# Patient Record
Sex: Male | Born: 1955 | Race: Black or African American | Hispanic: No | Marital: Single | Smoking: Never smoker
Health system: Southern US, Community
[De-identification: ages and names within clinical notes are randomized; demographics above are authoritative.]

## PROBLEM LIST (undated history)

## (undated) DIAGNOSIS — M109 Gout, unspecified: Secondary | ICD-10-CM

## (undated) DIAGNOSIS — B1910 Unspecified viral hepatitis B without hepatic coma: Secondary | ICD-10-CM

## (undated) HISTORY — DX: Gout, unspecified: M10.9

## (undated) HISTORY — DX: Unspecified viral hepatitis B without hepatic coma: B19.10

## (undated) HISTORY — PX: ABDOMINAL SURGERY: SHX537

## (undated) HISTORY — PX: HERNIA REPAIR: SHX51

---

## 2001-12-31 ENCOUNTER — Ambulatory Visit (HOSPITAL_COMMUNITY): Admission: RE | Admit: 2001-12-31 | Discharge: 2001-12-31 | Payer: Self-pay | Admitting: Family Medicine

## 2001-12-31 ENCOUNTER — Encounter: Payer: Self-pay | Admitting: Family Medicine

## 2003-08-12 ENCOUNTER — Encounter (INDEPENDENT_AMBULATORY_CARE_PROVIDER_SITE_OTHER): Payer: Self-pay | Admitting: Specialist

## 2003-08-12 ENCOUNTER — Ambulatory Visit (HOSPITAL_COMMUNITY): Admission: RE | Admit: 2003-08-12 | Discharge: 2003-08-12 | Payer: Self-pay | Admitting: Neurosurgery

## 2006-11-16 ENCOUNTER — Emergency Department (HOSPITAL_COMMUNITY): Admission: EM | Admit: 2006-11-16 | Discharge: 2006-11-16 | Payer: Self-pay | Admitting: Emergency Medicine

## 2007-03-03 ENCOUNTER — Emergency Department (HOSPITAL_COMMUNITY): Admission: EM | Admit: 2007-03-03 | Discharge: 2007-03-03 | Payer: Self-pay | Admitting: *Deleted

## 2010-08-24 NOTE — Op Note (Signed)
Jason Schaefer, Jason Schaefer                           ACCOUNT NO.:  [id number]   MEDICAL RECORD NO.:  [id number]                   PATIENT TYPE:  OIB   LOCATION:  2899                                 FACILITY:  [facility]   PHYSICIAN:  Coletta Memos, M.D.                  DATE OF BIRTH:  January 26, 1956   DATE OF PROCEDURE:  08/12/2003  DATE OF DISCHARGE:                                 OPERATIVE REPORT   PREOPERATIVE DIAGNOSIS:  Generalized muscle weakness.   POSTOPERATIVE DIAGNOSIS:  Generalized muscle weakness.   PROCEDURE:  Right deltoid muscle biopsy.   COMPLICATIONS:  None.   SPECIMENS:  Four pieces of muscle, two pieces of muscle placed on tension.   ANESTHESIA:  General via laryngeal mask airway.   INDICATIONS:  Jason Schaefer was sent to me for a deltoid muscle biopsy  secondary to unknown etiology causing generalized weakness.   The patient was brought to the operating room, placed under general  anesthetic via laryngeal mask airway.  His right shoulder was prepped, and  he was draped in a sterile fashion.  I infiltrated 7 mL of 0.5% lidocaine  and 1:200,000 strength epinephrine postoperatively after I had finished  taking the biopsy samples.  I opened the skin with a #15 blade and took that  down through the superficial layers.  I then identified the muscle and took  four pieces of muscle, two of which I placed on tension, two of which were  free pieces.  There was no bleeding from the wound.  Hemostasis was  obtained.  I then closed the wound in a layered fashion.  I infiltrated the  wound at that point with a local anesthetic.  Dermabond was used for a  sterile dressing.  The patient tolerated the procedure well.                                               Coletta Memos, M.D.    KC/MEDQ  D:  08/12/2003  T:  08/13/2003  Job:  [id number]

## 2011-03-01 ENCOUNTER — Encounter: Payer: Self-pay | Admitting: Emergency Medicine

## 2011-03-01 ENCOUNTER — Emergency Department (HOSPITAL_COMMUNITY)
Admission: EM | Admit: 2011-03-01 | Discharge: 2011-03-01 | Disposition: A | Discharge status: Home / Self Care | Payer: Self-pay | Attending: Emergency Medicine | Admitting: Emergency Medicine

## 2011-03-01 DIAGNOSIS — M109 Gout, unspecified: Secondary | ICD-10-CM | POA: Insufficient documentation

## 2011-03-01 DIAGNOSIS — M25579 Pain in unspecified ankle and joints of unspecified foot: Secondary | ICD-10-CM | POA: Insufficient documentation

## 2011-03-01 LAB — GLUCOSE, CAPILLARY: Glucose-Capillary: 98 mg/dL (ref 70–99)

## 2011-03-01 MED ORDER — HYDROCODONE-ACETAMINOPHEN 5-325 MG PO TABS: 2.0000 | ORAL_TABLET | ORAL | Status: AC | PRN

## 2011-03-01 MED ORDER — INDOMETHACIN 50 MG PO CAPS: 50.0000 mg | ORAL_CAPSULE | Freq: Three times a day (TID) | ORAL | Status: AC

## 2011-03-01 NOTE — ED Provider Notes (Signed)
History     CSN: [id number] Arrival date & time: 03/01/2011  4:06 PM   First MD Initiated Contact with Patient 03/01/11 1848      Chief Complaint  Patient presents with  . Ankle Pain    LT ankle pain/swelling x 2 wks.  unsure if injured it    HPI: Patient is a 55 y.o. male presenting with ankle pain. The history is provided by the patient.  Ankle Pain  The incident occurred more than 1 week ago. There was no injury mechanism. The pain is present in the left foot. The quality of the pain is described as throbbing.  Reports increasing pain to (R) ankle x 2 weeks. Admits to hx of gout and pain is very similar to gout but her states he has not had in the ankle before. Denies injury.  History reviewed. No pertinent past medical history.  History reviewed. No pertinent past surgical history.  No family history on file.  History  Substance Use Topics  . Smoking status: Never Smoker   . Smokeless tobacco: Not on file  . Alcohol Use: Yes      Review of Systems  Constitutional: Negative.   HENT: Negative.   Eyes: Negative.   Respiratory: Negative.   Cardiovascular: Negative.   Gastrointestinal: Negative.   Genitourinary: Negative.   Musculoskeletal: Negative.   Skin: Negative.   Neurological: Negative.   Hematological: Negative.   Psychiatric/Behavioral: Negative.     Allergies  Review of patient's allergies indicates no known allergies.  Home Medications  No current outpatient prescriptions on file.  BP 141/73  Pulse 65  Temp(Src) 99.1 F (37.3 C) (Oral)  Resp 20  SpO2 98%  Physical Exam  Constitutional: He is oriented to person, place, and time. He appears well-developed and well-nourished.  HENT:  Head: Normocephalic and atraumatic.  Eyes: Conjunctivae are normal.  Cardiovascular: Normal rate.   Pulmonary/Chest: Effort normal.  Musculoskeletal:       Feet:       Slight swelling, mild erythema and TTP to medial aspect of (L) ankel.  Neurological: He  is alert and oriented to person, place, and time.  Skin: Skin is warm and dry.  Psychiatric: He has a normal mood and affect.    ED Course  Procedures (including critical care time)   Labs Reviewed  GLUCOSE, CAPILLARY  POCT CBG MONITORING   No results found.   No diagnosis found.    MDM  HPI and PE c/w gout.        Leanne Chang, NP 03/01/11 1935

## 2011-03-01 NOTE — ED Provider Notes (Signed)
Medical screening examination/treatment/procedure(s) were performed by non-physician practitioner and as supervising physician I was immediately available for consultation/collaboration.  Sylvester Minton L Tywan Siever, MD 03/01/11 2349 

## 2011-03-01 NOTE — ED Notes (Signed)
Denies any injury states that he has swelling to lt ankle and that he was dx as a possible diabetic a long time ago but never was seen for this, also  Has blurred vission at times and when he sleeps he awakens to his clothes wet.

## 2014-06-02 ENCOUNTER — Encounter (HOSPITAL_COMMUNITY): Payer: Self-pay

## 2014-06-02 ENCOUNTER — Emergency Department (HOSPITAL_COMMUNITY)
Admission: EM | Admit: 2014-06-02 | Discharge: 2014-06-02 | Disposition: A | Discharge status: Home / Self Care | Payer: Self-pay | Attending: Emergency Medicine | Admitting: Emergency Medicine

## 2014-06-02 DIAGNOSIS — M109 Gout, unspecified: Secondary | ICD-10-CM

## 2014-06-02 DIAGNOSIS — Z79899 Other long term (current) drug therapy: Secondary | ICD-10-CM | POA: Insufficient documentation

## 2014-06-02 DIAGNOSIS — M10062 Idiopathic gout, left knee: Secondary | ICD-10-CM | POA: Insufficient documentation

## 2014-06-02 DIAGNOSIS — M25462 Effusion, left knee: Secondary | ICD-10-CM

## 2014-06-02 MED ORDER — OXYCODONE-ACETAMINOPHEN 5-325 MG PO TABS: 1.0000 | ORAL_TABLET | Freq: Four times a day (QID) | ORAL | Status: DC | PRN

## 2014-06-02 MED ORDER — KETOROLAC TROMETHAMINE 30 MG/ML IJ SOLN
30.0000 mg | Freq: Once | INTRAMUSCULAR | Status: AC
  Administered 2014-06-02: 30 mg via INTRAMUSCULAR
  Filled 2014-06-02: qty 1

## 2014-06-02 MED ORDER — PREDNISONE 20 MG PO TABS: ORAL_TABLET | ORAL | Status: DC

## 2014-06-02 NOTE — ED Notes (Signed)
Patient reports left knee swelling x 2 weeks.  States today the pain is unbearable.

## 2014-06-02 NOTE — ED Provider Notes (Signed)
CSN: [id number]     Arrival date & time 06/02/14  0505 History   First MD Initiated Contact with Patient 06/02/14 [phone]     Chief Complaint  Patient presents with  . Joint Swelling     (Consider location/radiation/quality/duration/timing/severity/associated sxs/prior Treatment) HPI Comments: Jason Schaefer is a 59 y.o. Male with a PMHx of gout, who presents to the ED with complaints of left knee pain 2 weeks. He was seen by his primary care doctor who gave him allopurinol and prednisone 2 weeks ago which relieved his symptoms for approximately 5 days during the prednisone course, but when he stopped taking prednisone the pain returned. Pain is 9/10 constant throbbing located to the medial aspect of the left knee, nonradiating, worse with movement and weightbearing, and improved with allopurinol, prednisone, and oxycodone. There is associated swelling. Denies any fevers, chills, chest pain or shortness of breath, abdominal pain, nausea, vomiting, diarrhea, dysuria, hematuria, penile discharge, numbness, tingling, weakness, erythema, or warmth. No history of injury.  Patient is a 59 y.o. male presenting with knee pain. The history is provided by the patient. No language interpreter was used.  Knee Pain Location:  Knee Time since incident:  2 weeks Injury: no   Knee location:  L knee Pain details:    Quality:  Throbbing   Radiates to:  Does not radiate   Severity:  Severe   Onset quality:  Gradual   Duration:  2 weeks   Timing:  Constant   Progression:  Unchanged Chronicity:  Recurrent Prior injury to area:  No Relieved by: oxycodone, prednisone, and allopurinol. Worsened by:  Bearing weight and activity Ineffective treatments:  None tried Associated symptoms: decreased ROM (due to pain) and swelling   Associated symptoms: no back pain, no fever, no muscle weakness, no numbness and no tingling     History reviewed. No pertinent past medical history. Past Surgical History  Procedure  Laterality Date  . Abdominal surgery     History reviewed. No pertinent family history. History  Substance Use Topics  . Smoking status: Never Smoker   . Smokeless tobacco: Not on file  . Alcohol Use: No    Review of Systems  Constitutional: Negative for fever and chills.  Respiratory: Negative for shortness of breath.   Cardiovascular: Negative for chest pain.  Gastrointestinal: Negative for nausea, vomiting, abdominal pain, diarrhea and constipation.  Genitourinary: Negative for dysuria and hematuria.  Musculoskeletal: Positive for joint swelling (L knee) and arthralgias (L knee). Negative for myalgias and back pain.  Skin: Negative for rash.  Neurological: Negative for weakness and numbness.   10 Systems reviewed and are negative for acute change except as noted in the HPI.    Allergies  Review of patient's allergies indicates no known allergies.  Home Medications   Prior to Admission medications   Medication Sig Start Date End Date Taking? Authorizing Provider  allopurinol (ZYLOPRIM) 100 MG tablet Take 100 mg by mouth daily.   Yes Historical Provider, MD  Oxycodone HCl 10 MG TABS Take 10 mg by mouth every 6 (six) hours as needed (pain).   Yes Historical Provider, MD   BP 176/83 mmHg  Pulse 97  Temp(Src) 98.5 F (36.9 C) (Oral)  Resp 16  Ht  (1.676 m)  Wt 165 lb (74.844 kg)  BMI 26.64 kg/m2  SpO2 98% Physical Exam  Constitutional: He is oriented to person, place, and time. Vital signs are normal. He appears well-developed and well-nourished.  Non-toxic appearance. No distress.  Afebrile,  nontoxic, NAD  HENT:  Head: Normocephalic and atraumatic.  Mouth/Throat: Mucous membranes are normal.  Eyes: Conjunctivae and EOM are normal. Right eye exhibits no discharge. Left eye exhibits no discharge.  Neck: Normal range of motion. Neck supple.  Cardiovascular: Normal rate and intact distal pulses.   Pulmonary/Chest: Effort normal. No respiratory distress.    Abdominal: Normal appearance. He exhibits no distension.  Musculoskeletal:       Left knee: He exhibits decreased range of motion (due to pain), swelling and effusion. He exhibits no deformity, no erythema, normal alignment, no LCL laxity, normal patellar mobility, no bony tenderness and no MCL laxity. Tenderness found. Medial joint line tenderness noted.       Legs: L knee with limited ROM due to pain but passive ROM intact, +medial joint line TTP, no bony TTP, mild swelling and effusion, no deformity, no bruising, no abnormal alignment or patellar mobility, no varus/valgus laxity, neg anterior drawer test, no crepitus. Mildly warm to touch, no erythema. Strength 5/5 in all extremities, sensation grossly intact, distal pulses intact  Neurological: He is alert and oriented to person, place, and time. He has normal strength. No sensory deficit.  Skin: Skin is warm, dry and intact. No rash noted.  Psychiatric: He has a normal mood and affect.  Nursing note and vitals reviewed.   ED Course  Procedures (including critical care time) Labs Review Labs Reviewed - No data to display  Imaging Review No results found.   EKG Interpretation None      MDM   Final diagnoses:  Knee swelling, left  Acute gout of left knee, unspecified cause    59 y.o. male with L knee pain c/w his gout flare. Recently started allopurinol and prednisone, after prednisone finished pain returned. Exam reveals slight warmth but passive ROM intact, doubt septic joint. Will treat with prednisone again and have him f/up with PCP in 3-5 days. I explained the diagnosis and have given explicit precautions to return to the ER including for any other new or worsening symptoms. The patient understands and accepts the medical plan as it's been dictated and I have answered their questions. Discharge instructions concerning home care and prescriptions have been given. The patient is STABLE and is discharged to home in good  condition.  BP 176/83 mmHg  Pulse 97  Temp(Src) 98.5 F (36.9 C) (Oral)  Resp 16  Ht 5\' 6"  (1.676 m)  Wt 165 lb (74.844 kg)  BMI 26.64 kg/m2  SpO2 98%  Meds ordered this encounter  Medications  . ketorolac (TORADOL) 30 MG/ML injection 30 mg    Sig:   . predniSONE (DELTASONE) 20 MG tablet    Sig: 3 tabs po x 5 days    Dispense:  15 tablet    Refill:  0    Order Specific Question:  Supervising Provider    Answer:  Eber HongMILLER, BRIAN D [[id number]]  . oxyCODONE-acetaminophen (PERCOCET/ROXICET) 5-325 MG per tablet    Sig: Take 1 tablet by mouth every 6 (six) hours as needed for moderate pain or severe pain.    Dispense:  15 tablet    Refill:  0    Order Specific Question:  Supervising Provider    Answer:  Vida RollerMILLER, BRIAN D [address][[id number]]       Fionn Stracke Strupp Mitchellamprubi-Soms, PA-C 06/02/14 [id number]  Derwood KaplanAnkit Nanavati, MD 06/03/14 [phone]

## 2014-06-02 NOTE — Discharge Instructions (Signed)
Your gout needs to be followed up by your regular doctor. Start taking prednisone as directed. Use ibuprofen as needed for pain. Use oxycodone as needed for pain, but don't drive while taking this medication. Use heat as needed for pain. Consume a low purine diet. Follow up in 3-5 days with your primary doctor for recheck. Return to the ER for changes or worsening symptoms.   Gout Gout is when your joints become red, sore, and swell (inflamed). This is caused by the buildup of uric acid crystals in the joints. Uric acid is a chemical that is normally in the blood. If the level of uric acid gets too high in the blood, these crystals form in your joints and tissues. Over time, these crystals can form into masses near the joints and tissues. These masses can destroy bone and cause the bone to look misshapen (deformed). HOME CARE   Do not take aspirin for pain.  Only take medicine as told by your doctor.  Rest the joint as much as you can. When in bed, keep sheets and blankets off painful areas.  Keep the sore joints raised (elevated).  Put warm or cold packs on painful joints. Use of warm or cold packs depends on which works best for you.  Use crutches if the painful joint is in your leg.  Drink enough fluids to keep your pee (urine) clear or pale yellow. Limit alcohol, sugary drinks, and drinks with fructose in them.  Follow your diet instructions. Pay careful attention to how much protein you eat. Include fruits, vegetables, whole grains, and fat-free or low-fat milk products in your daily diet. Talk to your doctor or dietitian about the use of coffee, vitamin C, and cherries. These may help lower uric acid levels.  Keep a healthy body weight. GET HELP RIGHT AWAY IF:   You have watery poop (diarrhea), throw up (vomit), or have any side effects from medicines.  You do not feel better in 24 hours, or you are getting worse.  Your joint becomes suddenly more tender, and you have chills or a  fever. MAKE SURE YOU:   Understand these instructions.  Will watch your condition.  Will get help right away if you are not doing well or get worse. Document Released: 01/02/2008 Document Revised: 08/09/2013 Document Reviewed: 11/06/2011 [facility] Patient Information 2015 [place], [place]. This information is not intended to replace advice given to you by your health care provider. Make sure you discuss any questions you have with your health care provider.  Low-Purine Diet Purines are compounds that affect the level of uric acid in your body. A low-purine diet is a diet that is low in purines. Eating a low-purine diet can prevent the level of uric acid in your body from getting too high and causing gout or kidney stones or both. WHAT DO I NEED TO KNOW ABOUT THIS DIET?  Choose low-purine foods. Examples of low-purine foods are listed in the next section.  Drink plenty of fluids, especially water. Fluids can help remove uric acid from your body. Try to drink 8-16 cups (1.9-3.8 L) a day.  Limit foods high in fat, especially saturated fat, as fat makes it harder for the body to get rid of uric acid. Foods high in saturated fat include pizza, cheese, ice cream, whole milk, fried foods, and gravies. Choose foods that are lower in fat and lean sources of protein. Use olive oil when cooking as it contains healthy fats that are not high in saturated fat.  Limit alcohol. Alcohol interferes with the elimination of uric acid from your body. If you are having a gout attack, avoid all alcohol.  Keep in mind that different people's bodies react differently to different foods. You will probably learn over time which foods do or do not affect you. If you discover that a food tends to cause your gout to flare up, avoid eating that food. You can more freely enjoy foods that do not cause problems. If you have any questions about a food item, talk to your dietitian or health care provider. WHICH FOODS ARE LOW,  MODERATE, AND HIGH IN PURINES? The following is a list of foods that are low, moderate, and high in purines. You can eat any amount of the foods that are low in purines. You may be able to have small amounts of foods that are moderate in purines. Ask your health care provider how much of a food moderate in purines you can have. Avoid foods high in purines. Grains  Foods low in purines: Enriched white bread, pasta, rice, cake, cornbread, popcorn.  Foods moderate in purines: Whole-grain breads and cereals, wheat germ, bran, oatmeal. Uncooked oatmeal. Dry wheat bran or wheat germ.  Foods high in purines: Pancakes, French toast, biscuits, muffins. Vegetables  Foods [place] in purines: All vegetables, except those that are moderate in purines.  Foods moderate in purines: Asparagus, cauliflower, spinach, mushrooms, green peas. Fruits  All fruits are low in purines. Meats and other Protein Foods  Foods low in purines: Eggs, nuts, peanut butter.  Foods moderate in purines: 80-90% lean beef, lamb, veal, pork, poultry, fish, eggs, peanut butter, nuts. Crab, lobster, oysters, and shrimp. Cooked dried beans, peas, and lentils.  Foods high in purines: Anchovies, sardines, herring, mussels, tuna, codfish, scallops, trout, and haddock. Tomasa BlaseBacon. Organ meats (such as liver or kidney). Tripe. Game meat. Goose. Sweetbreads. Dairy  All dairy foods are low in purines. Low-fat and fat-free dairy products are best because they are low in saturated fat. Beverages  Drinks low in purines: Water, carbonated beverages, tea, coffee, cocoa.  Drinks moderate in purines: Soft drinks and other drinks sweetened with high-fructose corn syrup. Juices. To find whether a food or drink is sweetened with high-fructose corn syrup, look at the ingredients list.  Drinks high in purines: Alcoholic beverages (such as beer). Condiments  Foods low in purines: Salt, herbs, olives, pickles, relishes, vinegar.  Foods moderate in  purines: Butter, margarine, oils, mayonnaise. Fats and Oils  Foods low in purines: All types, except gravies and sauces made with meat.  Foods high in purines: Gravies and sauces made with meat. Other Foods  Foods low in purines: Sugars, sweets, gelatin. Cake. Soups made without meat.  Foods moderate in purines: Meat-based or fish-based soups, broths, or bouillons. Foods and drinks sweetened with high-fructose corn syrup.  Foods high in purines: High-fat desserts (such as ice cream, cookies, cakes, pies, doughnuts, and chocolate). Contact your dietitian for more information on foods that are not listed here. Document Released: 07/20/2010 Document Revised: 03/30/2013 Document Reviewed: 03/01/2013 [facility] Patient Information 2015 [place]. This information is not intended to replace advice given to you by your health care provider. Make sure you discuss any questions you have with your health care provider.  Heat Therapy Heat therapy can help make painful, stiff muscles and joints feel better. Do not use heat on new injuries. Wait at least 48 hours after an injury to use heat. Do not use heat when you have aches or pains  right after an activity. If you still have pain 3 hours after stopping the activity, then you may use heat. HOME CARE Wet heat pack  Soak a clean towel in warm water. Squeeze out the extra water.  Put the warm, wet towel in a plastic bag.  Place a thin, dry towel between your skin and the bag.  Put the heat pack on the area for 5 minutes, and check your skin. Your skin may be pink, but it should not be red.  Leave the heat pack on the area for 15 to 30 minutes.  Repeat this every 2 to 4 hours while awake. Do not use heat while you are sleeping. Warm water bath  Fill a tub with warm water.  Place the affected body part in the tub.  Soak the area for 20 to 40 minutes.  Repeat as needed. Hot water bottle  Fill the water bottle half full with hot  water.  Press out the extra air. Close the cap tightly.  Place a dry towel between your skin and the bottle.  Put the bottle on the area for 5 minutes, and check your skin. Your skin may be pink, but it should not be red.  Leave the bottle on the area for 15 to 30 minutes.  Repeat this every 2 to 4 hours while awake. Electric heating pad  Place a dry towel between your skin and the heating pad.  Set the heating pad on low heat.  Put the heating pad on the area for 10 minutes, and check your skin. Your skin may be pink, but it should not be red.  Leave the heating pad on the area for 20 to 40 minutes.  Repeat this every 2 to 4 hours while awake.  Do not lie on the heating pad.  Do not fall asleep while using the heating pad.  Do not use the heating pad near water. GET HELP RIGHT AWAY IF:  You get blisters or red skin.  Your skin is puffy (swollen), or you lose feeling (numbness) in the affected area.  You have any new problems.  Your problems are getting worse.  You have any questions or concerns. If you have any problems, stop using heat therapy until you see your doctor. MAKE SURE YOU:  Understand these instructions.  Will watch your condition.  Will get help right away if you are not doing well or get worse. Document Released: 06/17/2011 Document Reviewed: 05/18/2013 [facility] Patient Information 2015 [place], [place]. This information is not intended to replace advice given to you by your health care provider. Make sure you discuss any questions you have with your health care provider.

## 2014-06-02 NOTE — ED Notes (Signed)
Pt states he has swelling and pain to his left knee  Pt states he saw his pcp on the 11th and was put on allopurinal and prednisone  Pt states he took prednisone for 5 days and when he stopped it the swelling returned

## 2015-06-23 ENCOUNTER — Emergency Department (HOSPITAL_BASED_OUTPATIENT_CLINIC_OR_DEPARTMENT_OTHER)
Admission: EM | Admit: 2015-06-23 | Discharge: 2015-06-23 | Disposition: A | Discharge status: Home / Self Care | Payer: Self-pay | Attending: Emergency Medicine | Admitting: Emergency Medicine

## 2015-06-23 ENCOUNTER — Encounter (HOSPITAL_BASED_OUTPATIENT_CLINIC_OR_DEPARTMENT_OTHER): Payer: Self-pay

## 2015-06-23 DIAGNOSIS — M109 Gout, unspecified: Secondary | ICD-10-CM | POA: Insufficient documentation

## 2015-06-23 DIAGNOSIS — M25462 Effusion, left knee: Secondary | ICD-10-CM | POA: Insufficient documentation

## 2015-06-23 DIAGNOSIS — Z79899 Other long term (current) drug therapy: Secondary | ICD-10-CM | POA: Insufficient documentation

## 2015-06-23 MED ORDER — COLCHICINE 0.6 MG PO TABS: 0.6000 mg | ORAL_TABLET | Freq: Two times a day (BID) | ORAL | Status: DC

## 2015-06-23 MED ORDER — PREDNISONE 20 MG PO TABS: ORAL_TABLET | ORAL | Status: DC

## 2015-06-23 NOTE — ED Notes (Signed)
Pt reports left knee swelling x 3 years. Reports hx of gout. Reports last flare up was last year. Reports taking allopurinol and steroid with relief.

## 2015-06-23 NOTE — ED Provider Notes (Signed)
CSN: [id number]     Arrival date & time 06/23/15  1820 History   First MD Initiated Contact with Patient 06/23/15 1857     Chief Complaint  Patient presents with  . Joint Swelling     (Consider location/radiation/quality/duration/timing/severity/associated sxs/prior Treatment) HPI   60 year old male with hx of gout who presents complaining of left knee pain.  Pain started 3 days ago along with swelling.  Pain is burning 9/10 worsening with movement.  Has taken oxycodone earlier which helps.  Pain felt similar to prior gout.  Patient states he normally developed data pain once a year with last flare a year ago. He does admits to drinking alcohol, one beer on a daily basis but denies any seafood. He denies any injury, denies having fever, hip pain, ankle pain, or rash. He did take 1 oxycodone earlier which did help but states that in the past steroid and allopurinol has helped. No history of septic joint. No history of IV drug use. He is a nonsmoker.    History reviewed. No pertinent past medical history. Past Surgical History  Procedure Laterality Date  . Abdominal surgery     No family history on file. Social History  Substance Use Topics  . Smoking status: Never Smoker   . Smokeless tobacco: None  . Alcohol Use: No    Review of Systems  Constitutional: Negative for fever.  Musculoskeletal: Positive for joint swelling.  Skin: Negative for rash and wound.  Neurological: Negative for numbness.      Allergies  Review of patient's allergies indicates no known allergies.  Home Medications   Prior to Admission medications   Medication Sig Start Date End Date Taking? Authorizing Provider  allopurinol (ZYLOPRIM) 100 MG tablet Take 100 mg by mouth daily.    Historical Provider, MD  Oxycodone HCl 10 MG TABS Take 10 mg by mouth every 6 (six) hours as needed (pain).    Historical Provider, MD  oxyCODONE-acetaminophen (PERCOCET/ROXICET) 5-325 MG per tablet Take 1 tablet by mouth  every 6 (six) hours as needed for moderate pain or severe pain. 06/02/14   Mercedes Camprubi-Soms, PA-C  predniSONE (DELTASONE) 20 MG tablet 3 tabs po x 5 days 06/02/14   Mercedes Camprubi-Soms, PA-C   BP 156/100 mmHg  Pulse 93  Temp(Src) 98.5 F (36.9 C) (Oral)  Resp 16  Ht 5\' 6"  (1.676 m)  Wt 77.111 kg  BMI 27.45 kg/m2  SpO2 100% Physical Exam  Constitutional: He appears well-developed and well-nourished. No distress.  African-American male, nontoxic in appearance  HENT:  Head: Atraumatic.  Eyes: Conjunctivae are normal.  Neck: Neck supple.  Abdominal: Soft. There is no tenderness.  Musculoskeletal: He exhibits tenderness (Left knee: Knee is moderately swollen, with increasing pain with knee extension secondary to the edema. Mild warmth noted no erythema and no signs of injury. No gross deformity.).  Left hip and left ankle are nontender to palpation, intact distal pedal pulses, normal skin tone throughout. Sensation is intact.  Neurological: He is alert.  Skin: No rash noted.  Psychiatric: He has a normal mood and affect.  Nursing note and vitals reviewed.   ED Course  Procedures (including critical care time) Labs Review Labs Reviewed - No data to display  Imaging Review No results found. I have personally reviewed and evaluated these images and lab results as part of my medical decision-making.   EKG Interpretation None      MDM   Final diagnoses:  Knee swelling, left    BP  156/100 mmHg  Pulse 93  Temp(Src) 98.5 F (36.9 C) (Oral)  Resp 16  Ht  (1.676 m)  Wt 77.111 kg  BMI 27.45 kg/m2  SpO2 100%   8:04 PM Patient presents with left knee pain and swelling similar to prior gouty flare. I have low suspicion for septic arthritis. No injury to indicate advanced imaging. No systemic involvement. I will provide prednisone, and colchicine as treatment. Outpatient follow-up recommended.   Fayrene Helper, PA-C 06/23/15 2005  Tilden Fossa, MD 06/24/15  [phone]

## 2015-06-23 NOTE — ED Notes (Signed)
Pt c/o left knee pani.  Left knee not painful to touch, but very painful with movement.  Pt states he is unable to straighten left leg due to pain.  Pt reports taking allopurinol and prednisone for gout flareups and that those medicines do help, but not immediately.  Pt has not taken any gout medications yet with this flare up but has taken an oxycodone about 12 hours ago with some temporary relief.

## 2015-06-23 NOTE — Discharge Instructions (Signed)

## 2015-07-11 ENCOUNTER — Emergency Department (HOSPITAL_BASED_OUTPATIENT_CLINIC_OR_DEPARTMENT_OTHER)
Admission: EM | Admit: 2015-07-11 | Discharge: 2015-07-11 | Disposition: A | Discharge status: Home / Self Care | Payer: Self-pay | Attending: Emergency Medicine | Admitting: Emergency Medicine

## 2015-07-11 ENCOUNTER — Emergency Department (HOSPITAL_BASED_OUTPATIENT_CLINIC_OR_DEPARTMENT_OTHER): Payer: Self-pay

## 2015-07-11 ENCOUNTER — Encounter (HOSPITAL_BASED_OUTPATIENT_CLINIC_OR_DEPARTMENT_OTHER): Payer: Self-pay

## 2015-07-11 DIAGNOSIS — M109 Gout, unspecified: Secondary | ICD-10-CM

## 2015-07-11 DIAGNOSIS — M10062 Idiopathic gout, left knee: Secondary | ICD-10-CM | POA: Insufficient documentation

## 2015-07-11 MED ORDER — OXYCODONE-ACETAMINOPHEN 5-325 MG PO TABS: ORAL_TABLET | ORAL | Status: DC

## 2015-07-11 MED ORDER — ALLOPURINOL 300 MG PO TABS: 300.0000 mg | ORAL_TABLET | Freq: Every day | ORAL | Status: DC

## 2015-07-11 MED ORDER — KETOROLAC TROMETHAMINE 60 MG/2ML IM SOLN
60.0000 mg | Freq: Once | INTRAMUSCULAR | Status: AC
  Administered 2015-07-11: 60 mg via INTRAMUSCULAR
  Filled 2015-07-11: qty 2

## 2015-07-11 NOTE — Discharge Instructions (Signed)
Please follow with your primary care doctor in the next 2 days for a check-up. They must obtain records for further management.   Do not hesitate to return to the Emergency Department for any new, worsening or concerning symptoms.    Gout Gout is an inflammatory arthritis caused by a buildup of uric acid crystals in the joints. Uric acid is a chemical that is normally present in the blood. When the level of uric acid in the blood is too high it can form crystals that deposit in your joints and tissues. This causes joint redness, soreness, and swelling (inflammation). Repeat attacks are common. Over time, uric acid crystals can form into masses (tophi) near a joint, destroying bone and causing disfigurement. Gout is treatable and often preventable. CAUSES  The disease begins with elevated levels of uric acid in the blood. Uric acid is produced by your body when it breaks down a naturally found substance called purines. Certain foods you eat, such as meats and fish, contain high amounts of purines. Causes of an elevated uric acid level include:  Being passed down from parent to child (heredity).  Diseases that cause increased uric acid production (such as obesity, psoriasis, and certain cancers).  Excessive alcohol use.  Diet, especially diets rich in meat and seafood.  Medicines, including certain cancer-fighting medicines (chemotherapy), water pills (diuretics), and aspirin.  Chronic kidney disease. The kidneys are no longer able to remove uric acid well.  Problems with metabolism. Conditions strongly associated with gout include:  Obesity.  High blood pressure.  High cholesterol.  Diabetes. Not everyone with elevated uric acid levels gets gout. It is not understood why some people get gout and others do not. Surgery, joint injury, and eating too much of certain foods are some of the factors that can lead to gout attacks. SYMPTOMS   An attack of gout comes on quickly. It causes  intense pain with redness, swelling, and warmth in a joint.  Fever can occur.  Often, only one joint is involved. Certain joints are more commonly involved:  Base of the big toe.  Knee.  Ankle.  Wrist.  Finger. Without treatment, an attack usually goes away in a few days to weeks. Between attacks, you usually will not have symptoms, which is different from many other forms of arthritis. DIAGNOSIS  Your caregiver will suspect gout based on your symptoms and exam. In some cases, tests may be recommended. The tests may include:  Blood tests.  Urine tests.  X-rays.  Joint fluid exam. This exam requires a needle to remove fluid from the joint (arthrocentesis). Using a microscope, gout is confirmed when uric acid crystals are seen in the joint fluid. TREATMENT  There are two phases to gout treatment: treating the sudden onset (acute) attack and preventing attacks (prophylaxis).  Treatment of an Acute Attack.  Medicines are used. These include anti-inflammatory medicines or steroid medicines.  An injection of steroid medicine into the affected joint is sometimes necessary.  The painful joint is rested. Movement can worsen the arthritis.  You may use warm or cold treatments on painful joints, depending which works best for you.  Treatment to Prevent Attacks.  If you suffer from frequent gout attacks, your caregiver may advise preventive medicine. These medicines are started after the acute attack subsides. These medicines either help your kidneys eliminate uric acid from your body or decrease your uric acid production. You may need to stay on these medicines for a very long time.  The early phase  of treatment with preventive medicine can be associated with an increase in acute gout attacks. For this reason, during the first few months of treatment, your caregiver may also advise you to take medicines usually used for acute gout treatment. Be sure you understand your caregiver's  directions. Your caregiver may make several adjustments to your medicine dose before these medicines are effective.  Discuss dietary treatment with your caregiver or dietitian. Alcohol and drinks high in sugar and fructose and foods such as meat, poultry, and seafood can increase uric acid levels. Your caregiver or dietitian can advise you on drinks and foods that should be limited. HOME CARE INSTRUCTIONS   Do not take aspirin to relieve pain. This raises uric acid levels.  Only take over-the-counter or prescription medicines for pain, discomfort, or fever as directed by your caregiver.  Rest the joint as much as possible. When in bed, keep sheets and blankets off painful areas.  Keep the affected joint raised (elevated).  Apply warm or cold treatments to painful joints. Use of warm or cold treatments depends on which works best for you.  Use crutches if the painful joint is in your leg.  Drink enough fluids to keep your urine clear or pale yellow. This helps your body get rid of uric acid. Limit alcohol, sugary drinks, and fructose drinks.  Follow your dietary instructions. Pay careful attention to the amount of protein you eat. Your daily diet should emphasize fruits, vegetables, whole grains, and fat-free or low-fat milk products. Discuss the use of coffee, vitamin C, and cherries with your caregiver or dietitian. These may be helpful in lowering uric acid levels.  Maintain a healthy body weight. SEEK MEDICAL CARE IF:   You develop diarrhea, vomiting, or any side effects from medicines.  You do not feel better in 24 hours, or you are getting worse. SEEK IMMEDIATE MEDICAL CARE IF:   Your joint becomes suddenly more tender, and you have chills or a fever. MAKE SURE YOU:   Understand these instructions.  Will watch your condition.  Will get help right away if you are not doing well or get worse.   This information is not intended to replace advice given to you by your health  care provider. Make sure you discuss any questions you have with your health care provider.   Document Released: 03/22/2000 Document Revised: 04/15/2014 Document Reviewed: 11/06/2011 Elsevier Interactive Patient Education [employer] Elsevier Inc.

## 2015-07-11 NOTE — ED Provider Notes (Signed)
CSN: [id number]     Arrival date & time 07/11/15  1600 History   First MD Initiated Contact with Patient 07/11/15 1619     Chief Complaint  Patient presents with  . Leg Swelling     (Consider location/radiation/quality/duration/timing/severity/associated sxs/prior Treatment) HPI   Blood pressure 160/94, pulse 102, temperature 98.7 F (37.1 C), temperature source Oral, resp. rate 24, SpO2 99 %.  Jason Schaefer is a 60 y.o. male complaining of pain and swelling to left leg from knee to foot. History of gout and states that this feels similar to that however, Swelling never extended down the leg. Patient denies fever, chills, dysuria, chest pain, shortness of breath, cough, fever, history of DVT or PE, recent immobilizations, cancer. States that he was taking cold is seen but again palpitations so he quit. He is had no pain medication prior to arrival, most recent exacerbation onset 3 days ago.  History reviewed. No pertinent past medical history. Past Surgical History  Procedure Laterality Date  . Abdominal surgery     No family history on file. Social History  Substance Use Topics  . Smoking status: Never Smoker   . Smokeless tobacco: None  . Alcohol Use: Yes     Comment: weekly    Review of Systems  10 systems reviewed and found to be negative, except as noted in the HPI.   Allergies  Review of patient's allergies indicates no known allergies.  Home Medications   Prior to Admission medications   Not on File   BP 160/94 mmHg  Pulse 102  Temp(Src) 98.7 F (37.1 C) (Oral)  Resp 24  SpO2 99% Physical Exam  Constitutional: He is oriented to person, place, and time. He appears well-developed and well-nourished. No distress.  HENT:  Head: Normocephalic.  Eyes: Conjunctivae and EOM are normal.  Cardiovascular: Normal rate.   Pulmonary/Chest: Effort normal. No stridor.  Musculoskeletal: Normal range of motion. He exhibits edema and tenderness.  Left knee with moderate  effusion, moderate warmth, mildly reduced range of motion, no overlying skin changes. Also has distal edema to the leg with diffuse tenderness to palpation along the calf. Distally neurovascularly intact.  Neurological: He is alert and oriented to person, place, and time.  Psychiatric: He has a normal mood and affect.  Nursing note and vitals reviewed.   ED Course  Procedures (including critical care time) Labs Review Labs Reviewed - No data to display  Imaging Review No results found. I have personally reviewed and evaluated these images and lab results as part of my medical decision-making.   EKG Interpretation None      MDM   Final diagnoses:  Acute gout of left knee, unspecified cause    Filed Vitals:   07/11/15 1603  BP: 160/94  Pulse: 102  Temp: 98.7 F (37.1 C)  TempSrc: Oral  Resp: 24  SpO2: 99%    Medications  ketorolac (TORADOL) injection 60 mg (60 mg Intramuscular Given 07/11/15 1803)    Jason Schaefer is 60 y.o. male presenting with Left knee swelling and swelling to left calf as well. Patient states that these are typical for his prior gout exacerbations. He is afebrile, don't think arthrocentesis would be beneficial for this patient considering he had multiple prior similar exacerbations. Patient does have swelling to the calf which is atypical for his gouty exacerbations, will obtain ultrasound to rule out DVT.  Lower extremity venous duplex is negative. Patient is requesting allopurinol, will also get Percocet, patient given referral to sports  medicine and information on foods to avoid.  Evaluation does not show pathology that would require ongoing emergent intervention or inpatient treatment. Pt is hemodynamically stable and mentating appropriately. Discussed findings and plan with patient/guardian, who agrees with care plan. All questions answered. Return precautions discussed and outpatient follow up given.   New Prescriptions   ALLOPURINOL (ZYLOPRIM)  300 MG TABLET    Take 1 tablet (300 mg total) by mouth daily.   OXYCODONE-ACETAMINOPHEN (PERCOCET/ROXICET) 5-325 MG TABLET    1 to 2 tabs PO q6hrs  PRN for pain         Jason Emeryicole Ulysses Alper, PA-C 07/11/15 1942  Lavera Guiseana Duo Liu, MD 07/12/15 1229

## 2015-07-11 NOTE — ED Notes (Addendum)
C/o swelling to from left knee to foot-denies injury-seen here for same in March-states was better until completed meds then swelling returned-NAD-ambulating with one crutch-pt later stated he did not fill all the colchicine med due to cost and has been taking aleve

## 2015-07-11 NOTE — ED Notes (Signed)
Patient was seen most recently on 06/23/2015 for this same complaint. Patient reports swelling to left leg, from knee to foot. Patient placed in gown for examination. No significant swelling noted. Patient denies suggest follow up from recent visit as he does not have insurance and could not afford the visit. Patient states he called here and was told to come back in for recheck.   Patient was prescribed prednisone and colchicine at the last visit, states he took all the prednisone but only got 3 tablets of the colchicine filled. He states the colchicine made him feel funny and would like to be placed on allopurinol.

## 2017-03-25 ENCOUNTER — Ambulatory Visit (INDEPENDENT_AMBULATORY_CARE_PROVIDER_SITE_OTHER): Payer: Self-pay | Admitting: Urgent Care

## 2017-03-25 ENCOUNTER — Ambulatory Visit (INDEPENDENT_AMBULATORY_CARE_PROVIDER_SITE_OTHER): Payer: Self-pay

## 2017-03-25 ENCOUNTER — Encounter: Payer: Self-pay | Admitting: Urgent Care

## 2017-03-25 VITALS — BP 124/80 | HR 95 | Temp 98.0°F | Resp 17 | Ht 67.0 in | Wt 176.0 lb

## 2017-03-25 DIAGNOSIS — R059 Cough, unspecified: Secondary | ICD-10-CM

## 2017-03-25 DIAGNOSIS — J189 Pneumonia, unspecified organism: Secondary | ICD-10-CM

## 2017-03-25 DIAGNOSIS — R05 Cough: Secondary | ICD-10-CM

## 2017-03-25 DIAGNOSIS — R5381 Other malaise: Secondary | ICD-10-CM

## 2017-03-25 DIAGNOSIS — R0602 Shortness of breath: Secondary | ICD-10-CM

## 2017-03-25 DIAGNOSIS — R5383 Other fatigue: Secondary | ICD-10-CM

## 2017-03-25 MED ORDER — BENZONATATE 100 MG PO CAPS: 100.0000 mg | ORAL_CAPSULE | Freq: Three times a day (TID) | ORAL | 0 refills | Status: DC | PRN

## 2017-03-25 MED ORDER — HYDROCODONE-HOMATROPINE 5-1.5 MG/5ML PO SYRP: 5.0000 mL | ORAL_SOLUTION | Freq: Every evening | ORAL | 0 refills | Status: DC | PRN

## 2017-03-25 MED ORDER — ALBUTEROL SULFATE HFA 108 (90 BASE) MCG/ACT IN AERS: 2.0000 | INHALATION_SPRAY | Freq: Four times a day (QID) | RESPIRATORY_TRACT | 1 refills | Status: DC | PRN

## 2017-03-25 MED ORDER — AZITHROMYCIN 250 MG PO TABS: ORAL_TABLET | ORAL | 0 refills | Status: DC

## 2017-03-25 NOTE — Patient Instructions (Addendum)
Community-Acquired Pneumonia, Adult Pneumonia is an infection of the lungs. There are different types of pneumonia. One type can develop while a person is in a hospital. A different type, called community-acquired pneumonia, develops in people who are not, or have not recently been, in the hospital or other health care facility. What are the causes? Pneumonia may be caused by bacteria, viruses, or funguses. Community-acquired pneumonia is often caused by Streptococcus pneumonia bacteria. These bacteria are often passed from one person to another by breathing in droplets from the cough or sneeze of an infected person. What increases the risk? The condition is more likely to develop in:  People who havechronic diseases, such as chronic obstructive pulmonary disease (COPD), asthma, congestive heart failure, cystic fibrosis, diabetes, or kidney disease.  People who haveearly-stage or late-stage HIV.  People who havesickle cell disease.  People who havehad their spleen removed (splenectomy).  People who havepoor dental hygiene.  People who havemedical conditions that increase the risk of breathing in (aspirating) secretions their own mouth and nose.  People who havea weakened immune system (immunocompromised).  People who smoke.  People whotravel to areas where pneumonia-causing germs commonly exist.  People whoare around animal habitats or animals that have pneumonia-causing germs, including birds, bats, rabbits, cats, and farm animals.  What are the signs or symptoms? Symptoms of this condition include:  Adry cough.  A wet (productive) cough.  Fever.  Sweating.  Chest pain, especially when breathing deeply or coughing.  Rapid breathing or difficulty breathing.  Shortness of breath.  Shaking chills.  Fatigue.  Muscle aches.  How is this diagnosed? Your health care provider will take a medical history and perform a physical exam. You may also have other tests,  including:  Imaging studies of your chest, including X-rays.  Tests to check your blood oxygen level and other blood gases.  Other tests on blood, mucus (sputum), fluid around your lungs (pleural fluid), and urine.  If your pneumonia is severe, other tests may be done to identify the specific cause of your illness. How is this treated? The type of treatment that you receive depends on many factors, such as the cause of your pneumonia, the medicines you take, and other medical conditions that you have. For most adults, treatment and recovery from pneumonia may occur at home. In some cases, treatment must happen in a hospital. Treatment may include:  Antibiotic medicines, if the pneumonia was caused by bacteria.  Antiviral medicines, if the pneumonia was caused by a virus.  Medicines that are given by mouth or through an IV tube.  Oxygen.  Respiratory therapy.  Although rare, treating severe pneumonia may include:  Mechanical ventilation. This is done if you are not breathing well on your own and you cannot maintain a safe blood oxygen level.  Thoracentesis. This procedureremoves fluid around one lung or both lungs to help you breathe better.  Follow these instructions at home:  Take over-the-counter and prescription medicines only as told by your health care provider. ? Only takecough medicine if you are losing sleep. Understand that cough medicine can prevent your body's natural ability to remove mucus from your lungs. ? If you were prescribed an antibiotic medicine, take it as told by your health care provider. Do not stop taking the antibiotic even if you start to feel better.  Sleep in a semi-upright position at night. Try sleeping in a reclining chair, or place a few pillows under your head.  Do not use tobacco products, including cigarettes, chewing   tobacco, and e-cigarettes. If you need help quitting, ask your health care provider.  Drink enough water to keep your urine  clear or pale yellow. This will help to thin out mucus secretions in your lungs. How is this prevented? There are ways that you can decrease your risk of developing community-acquired pneumonia. Consider getting a pneumococcal vaccine if:  You are older than 61 years of age.  You are older than 61 years of age and are undergoing cancer treatment, have chronic lung disease, or have other medical conditions that affect your immune system. Ask your health care provider if this applies to you.  There are different types and schedules of pneumococcal vaccines. Ask your health care provider which vaccination option is best for you. You may also prevent community-acquired pneumonia if you take these actions:  Get an influenza vaccine every year. Ask your health care provider which type of influenza vaccine is best for you.  Go to the dentist on a regular basis.  Wash your hands often. Use hand sanitizer if soap and water are not available.  Contact a health care provider if:  You have a fever.  You are losing sleep because you cannot control your cough with cough medicine. Get help right away if:  You have worsening shortness of breath.  You have increased chest pain.  Your sickness becomes worse, especially if you are an older adult or have a weakened immune system.  You cough up blood. This information is not intended to replace advice given to you by your health care provider. Make sure you discuss any questions you have with your health care provider. Document Released: 03/25/2005 Document Revised: 08/03/2015 Document Reviewed: 07/20/2014 Elsevier Interactive Patient Education  2017 [employer]    IF you received an x-ray today, you will receive [facility] invoice from [facility]. Please contact [facility] at [phone] with questions or concerns regarding your invoice.   IF you received labwork today, you will receive an invoice from [facility]. Please contact  [facility] at [phone] with questions or concerns regarding your invoice.   Our billing staff will not be able to assist you with questions regarding bills from these companies.  You will be contacted with the lab results as soon as they are available. The fastest way to get your results is to activate your My Chart account. Instructions are located on the last page of this paperwork. If you have not heard from us regarding the results in 2 weeks, please contact this office.

## 2017-03-25 NOTE — Progress Notes (Signed)
  MRN: [id number] DOB: 1955/11/02  Subjective:   Jason Schaefer is a 61 y.o. male presenting for 4 week history of fatigue, chest congestion, shortness of breath, dry cough, subjective fever. Has tried Sudafed a couple of times. Denies sinus pain, ear pain, throat pain, chest pain, n/v, abdominal pain. Denies history of allergies, asthma. Denies smoking cigarettes or drinking alcohol. Does not hydrate well. Has not traveled outside of the country, has been here in the [place] for [place].  Jason Schaefer is not currently taking any medications and has No Known Allergies.  Jason Schaefer  has a past medical history of Hepatitis B. Also  has a past surgical history that includes Abdominal surgery and Hernia repair.  Objective:   Vitals: BP 124/80   Pulse 95   Temp 98 F (36.7 C) (Oral)   Resp 17   Ht 5\' 7"  (1.702 m)   Wt 176 lb (79.8 kg)   SpO2 94%   BMI 27.57 kg/m   Physical Exam  Constitutional: He is oriented to person, place, and time. He appears well-developed and well-nourished.  HENT:  Mouth/Throat: Oropharynx is clear and moist.  Eyes: No scleral icterus.  Neck: Normal range of motion. Neck supple.  Cardiovascular: Normal rate, regular rhythm and intact distal pulses. Exam reveals no gallop and no friction rub.  No murmur heard. Pulmonary/Chest: No respiratory distress.  Patient unable to breathe deeply without coughing fit. Exam not feasible, chest x-ray ordered for further evaluation.  Abdominal: Soft. Bowel sounds are normal. He exhibits no distension and no mass. There is no tenderness. There is no guarding.  Well-healed surgical scar oriented vertically along mid-abdomen.  Musculoskeletal: He exhibits no edema.  Lymphadenopathy:    He has no cervical adenopathy.  Neurological: He is alert and oriented to person, place, and time.  Skin: Skin is warm and dry. Capillary refill takes less than 2 seconds.  Psychiatric: He has a normal mood and affect.   Dg Chest 2 View  Result Date:  03/25/2017 CLINICAL DATA:  Cough congestion and shortness of breath EXAM: CHEST  2 VIEW COMPARISON:  05/29/2007 FINDINGS: Low lung volumes. Small bilateral pleural effusions. Bilateral lung base left greater than right pulmonary infiltrates. Mild cardiomegaly. No pneumothorax. IMPRESSION: 1. Small pleural effusions with left greater than right lung base pulmonary infiltrates 2. Mild cardiomegaly Electronically Signed   By: Jasmine PangKim  Fujinaga M.D.   On: 03/25/2017 17:35   Assessment and Plan :   1. Community acquired pneumonia, unspecified laterality 2. Cough 3. Shortness of breath 4. Other fatigue 5. Malaise - Start azithromycin to address CAP, cough suppression therapy recommended. Offered albuterol for shortness of breath. Return-to-clinic precautions discussed, patient verbalized understanding. Otherwise, follow up in 1 week.  Wallis BambergMario Angelene Rome, PA-C [facility] [phone] 03/25/2017  5:09 PM

## 2017-03-26 ENCOUNTER — Other Ambulatory Visit: Payer: Self-pay | Admitting: Urgent Care

## 2017-03-26 DIAGNOSIS — B181 Chronic viral hepatitis B without delta-agent: Secondary | ICD-10-CM

## 2017-03-26 DIAGNOSIS — R748 Abnormal levels of other serum enzymes: Secondary | ICD-10-CM

## 2017-03-26 LAB — CBC WITH DIFFERENTIAL/PLATELET
BASOS: 0 %
Basophils Absolute: 0 10*3/uL (ref 0.0–0.2)
EOS (ABSOLUTE): 0.2 10*3/uL (ref 0.0–0.4)
Eos: 2 %
HEMATOCRIT: 41.7 % (ref 37.5–51.0)
HEMOGLOBIN: 14 g/dL (ref 13.0–17.7)
Immature Grans (Abs): 0 10*3/uL (ref 0.0–0.1)
Immature Granulocytes: 0 %
LYMPHS ABS: 2 10*3/uL (ref 0.7–3.1)
Lymphs: 27 %
MCH: 26.7 pg (ref 26.6–33.0)
MCHC: 33.6 g/dL (ref 31.5–35.7)
MCV: 80 fL (ref 79–97)
MONOCYTES: 14 %
Monocytes Absolute: 1 10*3/uL — ABNORMAL HIGH (ref 0.1–0.9)
NEUTROS ABS: 4.1 10*3/uL (ref 1.4–7.0)
Neutrophils: 57 %
Platelets: 274 10*3/uL (ref 150–379)
RBC: 5.24 x10E6/uL (ref 4.14–5.80)
RDW: 17.5 % — AB (ref 12.3–15.4)
WBC: 7.3 10*3/uL (ref 3.4–10.8)

## 2017-03-26 LAB — COMPREHENSIVE METABOLIC PANEL
A/G RATIO: 1 — AB (ref 1.2–2.2)
ALBUMIN: 3.1 g/dL — AB (ref 3.6–4.8)
ALT: 272 IU/L — ABNORMAL HIGH (ref 0–44)
AST: 347 IU/L — ABNORMAL HIGH (ref 0–40)
Alkaline Phosphatase: 67 IU/L (ref 39–117)
BILIRUBIN TOTAL: 0.4 mg/dL (ref 0.0–1.2)
BUN / CREAT RATIO: 19 (ref 10–24)
BUN: 14 mg/dL (ref 8–27)
CO2: 26 mmol/L (ref 20–29)
CREATININE: 0.74 mg/dL — AB (ref 0.76–1.27)
Calcium: 8.6 mg/dL (ref 8.6–10.2)
Chloride: 104 mmol/L (ref 96–106)
GFR calc Af Amer: 115 mL/min/{1.73_m2} (ref >59)
GFR calc non Af Amer: 100 mL/min/{1.73_m2} (ref >59)
GLOBULIN, TOTAL: 3.1 g/dL (ref 1.5–4.5)
Glucose: 85 mg/dL (ref 65–99)
POTASSIUM: 4.6 mmol/L (ref 3.5–5.2)
SODIUM: 144 mmol/L (ref 134–144)
Total Protein: 6.2 g/dL (ref 6.0–8.5)

## 2017-03-26 LAB — HEMOGLOBIN A1C
Est. average glucose Bld gHb Est-mCnc: 123 mg/dL
HEMOGLOBIN A1C: 5.9 % — AB (ref 4.8–5.6)

## 2017-04-02 ENCOUNTER — Ambulatory Visit (INDEPENDENT_AMBULATORY_CARE_PROVIDER_SITE_OTHER): Payer: Self-pay | Admitting: Urgent Care

## 2017-04-02 ENCOUNTER — Encounter: Payer: Self-pay | Admitting: Urgent Care

## 2017-04-02 ENCOUNTER — Other Ambulatory Visit: Payer: Self-pay | Admitting: Urgent Care

## 2017-04-02 VITALS — BP 148/81 | HR 92 | Temp 98.5°F | Resp 16 | Ht 67.0 in | Wt 172.8 lb

## 2017-04-02 DIAGNOSIS — R059 Cough, unspecified: Secondary | ICD-10-CM

## 2017-04-02 DIAGNOSIS — B181 Chronic viral hepatitis B without delta-agent: Secondary | ICD-10-CM

## 2017-04-02 DIAGNOSIS — R5383 Other fatigue: Secondary | ICD-10-CM

## 2017-04-02 DIAGNOSIS — R05 Cough: Secondary | ICD-10-CM

## 2017-04-02 DIAGNOSIS — J189 Pneumonia, unspecified organism: Secondary | ICD-10-CM

## 2017-04-02 MED ORDER — ALBUTEROL SULFATE HFA 108 (90 BASE) MCG/ACT IN AERS: 2.0000 | INHALATION_SPRAY | Freq: Four times a day (QID) | RESPIRATORY_TRACT | 1 refills | Status: AC | PRN

## 2017-04-02 MED ORDER — PREDNISONE 20 MG PO TABS: ORAL_TABLET | ORAL | 0 refills | Status: DC

## 2017-04-02 NOTE — Patient Instructions (Addendum)
Community-Acquired Pneumonia, Adult Pneumonia is an infection of the lungs. There are different types of pneumonia. One type can develop while a person is in a hospital. A different type, called community-acquired pneumonia, develops in people who are not, or have not recently been, in the hospital or other health care facility. What are the causes? Pneumonia may be caused by bacteria, viruses, or funguses. Community-acquired pneumonia is often caused by Streptococcus pneumonia bacteria. These bacteria are often passed from one person to another by breathing in droplets from the cough or sneeze of an infected person. What increases the risk? The condition is more likely to develop in:  People who havechronic diseases, such as chronic obstructive pulmonary disease (COPD), asthma, congestive heart failure, cystic fibrosis, diabetes, or kidney disease.  People who haveearly-stage or late-stage HIV.  People who havesickle cell disease.  People who havehad their spleen removed (splenectomy).  People who havepoor dental hygiene.  People who havemedical conditions that increase the risk of breathing in (aspirating) secretions their own mouth and nose.  People who havea weakened immune system (immunocompromised).  People who smoke.  People whotravel to areas where pneumonia-causing germs commonly exist.  People whoare around animal habitats or animals that have pneumonia-causing germs, including birds, bats, rabbits, cats, and farm animals.  What are the signs or symptoms? Symptoms of this condition include:  Adry cough.  A wet (productive) cough.  Fever.  Sweating.  Chest pain, especially when breathing deeply or coughing.  Rapid breathing or difficulty breathing.  Shortness of breath.  Shaking chills.  Fatigue.  Muscle aches.  How is this diagnosed? Your health care provider will take a medical history and perform a physical exam. You may also have other tests,  including:  Imaging studies of your chest, including X-rays.  Tests to check your blood oxygen level and other blood gases.  Other tests on blood, mucus (sputum), fluid around your lungs (pleural fluid), and urine.  If your pneumonia is severe, other tests may be done to identify the specific cause of your illness. How is this treated? The type of treatment that you receive depends on many factors, such as the cause of your pneumonia, the medicines you take, and other medical conditions that you have. For most adults, treatment and recovery from pneumonia may occur at home. In some cases, treatment must happen in a hospital. Treatment may include:  Antibiotic medicines, if the pneumonia was caused by bacteria.  Antiviral medicines, if the pneumonia was caused by a virus.  Medicines that are given by mouth or through an IV tube.  Oxygen.  Respiratory therapy.  Although rare, treating severe pneumonia may include:  Mechanical ventilation. This is done if you are not breathing well on your own and you cannot maintain a safe blood oxygen level.  Thoracentesis. This procedureremoves fluid around one lung or both lungs to help you breathe better.  Follow these instructions at home:  Take over-the-counter and prescription medicines only as told by your health care provider. ? Only takecough medicine if you are losing sleep. Understand that cough medicine can prevent your body's natural ability to remove mucus from your lungs. ? If you were prescribed an antibiotic medicine, take it as told by your health care provider. Do not stop taking the antibiotic even if you start to feel better.  Sleep in a semi-upright position at night. Try sleeping in a reclining chair, or place a few pillows under your head.  Do not use tobacco products, including cigarettes, chewing   tobacco, and e-cigarettes. If you need help quitting, ask your health care provider.  Drink enough water to keep your urine  clear or pale yellow. This will help to thin out mucus secretions in your lungs. How is this prevented? There are ways that you can decrease your risk of developing community-acquired pneumonia. Consider getting a pneumococcal vaccine if:  You are older than 61 years of age.  You are older than 61 years of age and are undergoing cancer treatment, have chronic lung disease, or have other medical conditions that affect your immune system. Ask your health care provider if this applies to you.  There are different types and schedules of pneumococcal vaccines. Ask your health care provider which vaccination option is best for you. You may also prevent community-acquired pneumonia if you take these actions:  Get an influenza vaccine every year. Ask your health care provider which type of influenza vaccine is best for you.  Go to the dentist on a regular basis.  Wash your hands often. Use hand sanitizer if soap and water are not available.  Contact a health care provider if:  You have a fever.  You are losing sleep because you cannot control your cough with cough medicine. Get help right away if:  You have worsening shortness of breath.  You have increased chest pain.  Your sickness becomes worse, especially if you are an older adult or have a weakened immune system.  You cough up blood. This information is not intended to replace advice given to you by your health care provider. Make sure you discuss any questions you have with your health care provider. Document Released: 03/25/2005 Document Revised: 08/03/2015 Document Reviewed: 07/20/2014 Elsevier Interactive Patient Education  2018 Elsevier Inc.     IF you received an x-ray today, you will receive an invoice from [facility]. Please contact [facility] at [phone] with questions or concerns regarding your invoice.   IF you received labwork today, you will receive an invoice from [facility]. Please contact  [facility] at [phone] with questions or concerns regarding your invoice.   Our billing staff will not be able to assist you with questions regarding bills from these companies.  You will be contacted with the lab results as soon as they are available. The fastest way to get your results is to activate your My Chart account. Instructions are located on the last page of this paperwork. If you have not heard from us regarding the results in 2 weeks, please contact this office.     

## 2017-04-02 NOTE — Progress Notes (Signed)
    MRN: [id number] DOB: 11-19-1955  Subjective:   Jason Schaefer is a 61 y.o. male presenting for follow up on pneumonia. Patient was last seen on 03/25/2017, started on azithromycin, cough suppression medications, albuterol inhaler. Today, he reports improvement in his symptoms, still has a cough, difficulty taking a deep breath. Denies fever, chest pain, shob, wheezing. He did not pick up the albuterol inhaler.  Jason Schaefer has a current medication list which includes the following prescription(s): benzonatate, hydrocodone-homatropine, and albuterol. Also is allergic to peanut-containing drug products.  Jason Schaefer  has a past medical history of Hepatitis B. Also  has a past surgical history that includes Abdominal surgery and Hernia repair.  Objective:   Vitals: BP (!) 148/81   Pulse 92   Temp 98.5 F (36.9 C) (Oral)   Resp 16   Ht 5\' 7"  (1.702 m)   Wt 172 lb 12.8 oz (78.4 kg)   SpO2 94%   BMI 27.06 kg/m   Physical Exam  Constitutional: He is oriented to person, place, and time. He appears well-developed and well-nourished.  Cardiovascular: Normal rate, regular rhythm and intact distal pulses. Exam reveals no gallop and no friction rub.  No murmur heard. Pulmonary/Chest: No respiratory distress.  Patient cannot take full breath without a coughing fit. Breathing is otherwise shallow but without tachypnea, rales.  Neurological: He is alert and oriented to person, place, and time.  Skin: Skin is warm and dry.  Psychiatric: He has a normal mood and affect.   Assessment and Plan :   Community acquired pneumonia, unspecified laterality  Cough  Other fatigue  Improved, will have patient start prednisone, albuterol inhaler. Will hold off on adding another antibiotic to see if he has improvement with today's treatment plan. Return-to-clinic precautions discussed, patient verbalized understanding.   Wallis BambergMario Roena Sassaman, PA-C [facility] [phone] 04/02/2017 4:51 PM

## 2017-04-18 ENCOUNTER — Ambulatory Visit (INDEPENDENT_AMBULATORY_CARE_PROVIDER_SITE_OTHER): Payer: Self-pay | Admitting: Urgent Care

## 2017-04-18 ENCOUNTER — Ambulatory Visit (INDEPENDENT_AMBULATORY_CARE_PROVIDER_SITE_OTHER): Payer: Self-pay

## 2017-04-18 ENCOUNTER — Encounter: Payer: Self-pay | Admitting: Urgent Care

## 2017-04-18 VITALS — BP 122/70 | HR 83 | Temp 99.1°F | Resp 18 | Ht 67.0 in | Wt 167.6 lb

## 2017-04-18 DIAGNOSIS — R0602 Shortness of breath: Secondary | ICD-10-CM

## 2017-04-18 DIAGNOSIS — J90 Pleural effusion, not elsewhere classified: Secondary | ICD-10-CM

## 2017-04-18 DIAGNOSIS — R05 Cough: Secondary | ICD-10-CM

## 2017-04-18 DIAGNOSIS — R059 Cough, unspecified: Secondary | ICD-10-CM

## 2017-04-18 DIAGNOSIS — J189 Pneumonia, unspecified organism: Secondary | ICD-10-CM

## 2017-04-18 LAB — POCT CBC
Granulocyte percent: 66.9 %G (ref 37–80)
HCT, POC: 42 % — AB (ref 43.5–53.7)
Hemoglobin: 13.7 g/dL — AB (ref 14.1–18.1)
Lymph, poc: 1.9 (ref 0.6–3.4)
MCH: 26 pg — AB (ref 27–31.2)
MCHC: 32.7 g/dL (ref 31.8–35.4)
MCV: 79.5 fL — AB (ref 80–97)
MID (CBC): 0.4 (ref 0–0.9)
MPV: 7.1 fL (ref 0–99.8)
POC Granulocyte: 4.7 (ref 2–6.9)
POC LYMPH PERCENT: 27.7 %L (ref 10–50)
POC MID %: 5.4 % (ref 0–12)
Platelet Count, POC: 291 10*3/uL (ref 142–424)
RBC: 5.29 M/uL (ref 4.69–6.13)
RDW, POC: 18.6 %
WBC: 7 10*3/uL (ref 4.6–10.2)

## 2017-04-18 MED ORDER — HYDROCODONE-HOMATROPINE 5-1.5 MG/5ML PO SYRP: 5.0000 mL | ORAL_SOLUTION | Freq: Every evening | ORAL | 0 refills | Status: DC | PRN

## 2017-04-18 MED ORDER — LEVOFLOXACIN 500 MG PO TABS: 500.0000 mg | ORAL_TABLET | Freq: Every day | ORAL | 0 refills | Status: DC

## 2017-04-18 NOTE — Patient Instructions (Addendum)
Community-Acquired Pneumonia, Adult Pneumonia is an infection of the lungs. There are different types of pneumonia. One type can develop while a person is in a hospital. A different type, called community-acquired pneumonia, develops in people who are not, or have not recently been, in the hospital or other health care facility. What are the causes? Pneumonia may be caused by bacteria, viruses, or funguses. Community-acquired pneumonia is often caused by Streptococcus pneumonia bacteria. These bacteria are often passed from one person to another by breathing in droplets from the cough or sneeze of an infected person. What increases the risk? The condition is more likely to develop in:  People who havechronic diseases, such as chronic obstructive pulmonary disease (COPD), asthma, congestive heart failure, cystic fibrosis, diabetes, or kidney disease.  People who haveearly-stage or late-stage HIV.  People who havesickle cell disease.  People who havehad their spleen removed (splenectomy).  People who havepoor dental hygiene.  People who havemedical conditions that increase the risk of breathing in (aspirating) secretions their own mouth and nose.  People who havea weakened immune system (immunocompromised).  People who smoke.  People whotravel to areas where pneumonia-causing germs commonly exist.  People whoare around animal habitats or animals that have pneumonia-causing germs, including birds, bats, rabbits, cats, and farm animals.  What are the signs or symptoms? Symptoms of this condition include:  Adry cough.  A wet (productive) cough.  Fever.  Sweating.  Chest pain, especially when breathing deeply or coughing.  Rapid breathing or difficulty breathing.  Shortness of breath.  Shaking chills.  Fatigue.  Muscle aches.  How is this diagnosed? Your health care provider will take a medical history and perform a physical exam. You may also have other tests,  including:  Imaging studies of your chest, including X-rays.  Tests to check your blood oxygen level and other blood gases.  Other tests on blood, mucus (sputum), fluid around your lungs (pleural fluid), and urine.  If your pneumonia is severe, other tests may be done to identify the specific cause of your illness. How is this treated? The type of treatment that you receive depends on many factors, such as the cause of your pneumonia, the medicines you take, and other medical conditions that you have. For most adults, treatment and recovery from pneumonia may occur at home. In some cases, treatment must happen in a hospital. Treatment may include:  Antibiotic medicines, if the pneumonia was caused by bacteria.  Antiviral medicines, if the pneumonia was caused by a virus.  Medicines that are given by mouth or through an IV tube.  Oxygen.  Respiratory therapy.  Although rare, treating severe pneumonia may include:  Mechanical ventilation. This is done if you are not breathing well on your own and you cannot maintain a safe blood oxygen level.  Thoracentesis. This procedureremoves fluid around one lung or both lungs to help you breathe better.  Follow these instructions at home:  Take over-the-counter and prescription medicines only as told by your health care provider. ? Only takecough medicine if you are losing sleep. Understand that cough medicine can prevent your body's natural ability to remove mucus from your lungs. ? If you were prescribed an antibiotic medicine, take it as told by your health care provider. Do not stop taking the antibiotic even if you start to feel better.  Sleep in a semi-upright position at night. Try sleeping in a reclining chair, or place a few pillows under your head.  Do not use tobacco products, including cigarettes, chewing   tobacco, and e-cigarettes. If you need help quitting, ask your health care provider.  Drink enough water to keep your urine  clear or pale yellow. This will help to thin out mucus secretions in your lungs. How is this prevented? There are ways that you can decrease your risk of developing community-acquired pneumonia. Consider getting a pneumococcal vaccine if:  You are older than 62 years of age.  You are older than 62 years of age and are undergoing cancer treatment, have chronic lung disease, or have other medical conditions that affect your immune system. Ask your health care provider if this applies to you.  There are different types and schedules of pneumococcal vaccines. Ask your health care provider which vaccination option is best for you. You may also prevent community-acquired pneumonia if you take these actions:  Get an influenza vaccine every year. Ask your health care provider which type of influenza vaccine is best for you.  Go to the dentist on a regular basis.  Wash your hands often. Use hand sanitizer if soap and water are not available.  Contact a health care provider if:  You have a fever.  You are losing sleep because you cannot control your cough with cough medicine. Get help right away if:  You have worsening shortness of breath.  You have increased chest pain.  Your sickness becomes worse, especially if you are an older adult or have a weakened immune system.  You cough up blood. This information is not intended to replace advice given to you by your health care provider. Make sure you discuss any questions you have with your health care provider. Document Released: 03/25/2005 Document Revised: 08/03/2015 Document Reviewed: 07/20/2014 Elsevier Interactive Patient Education  2018 [employer]     Pleural Effusion A pleural effusion is an abnormal buildup of fluid in the layers of tissue between your lungs and the inside of your chest (pleural space). These two layers of tissue that line both your lungs and the inside of your chest are called pleura. Usually, there is no air  in the space between the pleura, only a thin layer of fluid. If left untreated, a large amount of fluid can build up and cause the lung to collapse. A pleural effusion is usually caused by another disease that requires treatment. The two main types of pleural effusion are:  Transudative pleural effusion. This happens when fluid leaks into the pleural space because of a low protein count in your blood or high blood pressure in your vessels. Heart failure often causes this.  Exudative infusion. This occurs when fluid collects in the pleural space from blocked blood vessels or lymph vessels. Some lung diseases, injuries, and cancers can cause this type of effusion.  What are the causes? Pleural effusion can be caused by:  Heart failure.  A blood clot in the lung (pulmonary embolism).  Pneumonia.  Cancer.  Liver failure (cirrhosis).  Kidney disease.  Complications from surgery, such as from open heart surgery.  What are the signs or symptoms? In some cases, pleural effusion may cause no symptoms. Symptoms can include:  Shortness of breath, especially when lying down.  Chest pain, often worse when taking a deep breath.  Fever.  Dry cough that is lasting (chronic).  Hiccups.  Rapid breathing.  An underlying condition that is causing the pleural effusion (such as heart failure, pneumonia, blood clots, tuberculosis, or cancer) may also cause additional symptoms. How is this diagnosed? Your health care provider may suspect pleural effusion  based on your symptoms and medical history. Your health care provider will also do a physical exam and a chest X-ray. If the X-ray shows there is fluid in your chest, you may need to have this fluid removed using a needle (thoracentesis) so it can be tested. You may also have:  Imaging studies of the chest, such as: ? Ultrasound. ? CT scan.  Blood tests for kidney and liver function.  How is this treated? Treatment depends on the cause of  the pleural effusion. Treatment may include:  Taking antibiotic medicines to clear up an infection that is causing the pleural effusion.  Placing a tube in the chest to drain the effusion (tube thoracostomy). This procedure is often used when there is an infection in the fluid.  Surgery to remove the fibrous outer layer of tissue from the pleural space (decortication).  Thoracentesis, which can improve cough and shortness of breath.  A procedure to put medicine into the chest cavity to seal the pleural space to prevent fluid buildup (pleurodesis).  Chemotherapy and radiation therapy. These may be required in the case of cancerous (malignant) pleural effusion.  Follow these instructions at home:  Take medicines only as directed by your health care provider.  Keep track of how long you can gently exercise before you get short of breath. Try simply walking at first.  Do not use any tobacco products, including cigarettes, chewing tobacco, or electronic cigarettes. If you need help quitting, ask your health care provider.  Keep all follow-up visits as directed by your health care provider. This is important. Contact a health care provider if:  The amount of time that you are able to exercise decreases or does not improve with time.  You have pain or signs of infection at the puncture site if you had thoracentesis. Watch for: ? Drainage. ? Redness. ? Swelling.  You have a fever. Get help right away if:  You are short of breath.  You develop chest pain.  You develop a new cough. This information is not intended to replace advice given to you by your health care provider. Make sure you discuss any questions you have with your health care provider. Document Released: 03/25/2005 Document Revised: 08/28/2015 Document Reviewed: 08/18/2013 Elsevier Interactive Patient Education  2018 [employer].    IF you received an x-ray today, you will receive an invoice from [facility]. Please contact [facility] at [phone] with questions or concerns regarding your invoice.   IF you received labwork today, you will receive an invoice from [place]. Please contact [facility] at [phone] with questions or concerns regarding your invoice.   Our billing staff will not be able to assist you with questions regarding bills from these companies.  You will be contacted with the lab results as soon as they are available. The fastest way to get your results is to activate your My Chart account. Instructions are located on the last page of this paperwork. If you have not heard from [place] regarding the results in 2 weeks, please contact this office.

## 2017-04-18 NOTE — Progress Notes (Signed)
    MRN: [id number] DOB: [birth year]  Subjective:   Jason Schaefer is a 62 y.o. male presenting for follow up on community acquired pneumonia. Patient feels only slight improvement. Reports ongoing shortness of breath, dry cough. Denies fever, chest pain, wheezing, n/v, abdominal pain. Has undergone course of azithromycin, prednisone, is using albuterol once daily. Denies smoking cigarettes.   Jason Schaefer has a current medication list which includes the following prescription(s): albuterol. Also is allergic to peanut-containing drug products.  Jason Schaefer  has a past medical history of Hepatitis B. Also  has a past surgical history that includes Abdominal surgery and Hernia repair.  Objective:   Vitals: BP 122/70   Pulse 83   Temp 99.1 F (37.3 C) (Oral)   Resp 18   Ht 5\' 7"  (1.702 m)   Wt 167 lb 9.6 oz (76 kg)   SpO2 94%   BMI 26.25 kg/m   Physical Exam  Constitutional: He is oriented to person, place, and time. He appears well-developed and well-nourished.  Cardiovascular: Normal rate, regular rhythm and intact distal pulses. Exam reveals no gallop and no friction rub.  No murmur heard. Pulmonary/Chest: No respiratory distress. He has no wheezes. He has no rales.  Neurological: He is alert and oriented to person, place, and time.   Dg Chest 2 View  Result Date: 04/18/2017 CLINICAL DATA:  Community acquired pneumonia of unspecified laterality EXAM: CHEST  2 VIEW COMPARISON:  03/25/2017 FINDINGS: Enlargement of cardiac silhouette. Mediastinal contours and pulmonary vascularity normal. Bibasilar infiltrates and probable small BILATERAL pleural effusions little changed. Upper lungs clear. No pneumothorax. Bones unremarkable. IMPRESSION: Persistent bibasilar infiltrates with probable small pleural effusions. Electronically Signed   By: Ulyses SouthwardMark  Boles M.D.   On: 04/18/2017 17:07   Assessment and Plan :   Community acquired pneumonia, unspecified laterality - Plan: POCT CBC, DG Chest 2  View  Cough  Shortness of breath   Patient's pneumonia persists, will start levofloxacin with close follow up given stable vital signs. Schedule albuterol every 6 hours. Restart cough suppression medications. Return-to-clinic precautions discussed, patient verbalized understanding.   Wallis BambergMario Ellason Segar, PA-C [facility] [phone] 04/18/2017 4:55 PM

## 2017-04-21 ENCOUNTER — Ambulatory Visit: Payer: Self-pay | Admitting: Physician Assistant

## 2017-04-23 ENCOUNTER — Ambulatory Visit: Payer: Self-pay | Admitting: Physician Assistant

## 2017-04-23 ENCOUNTER — Encounter: Payer: Self-pay | Admitting: Physician Assistant

## 2017-04-23 VITALS — BP 98/66 | HR 86 | Temp 98.5°F | Resp 17 | Ht 66.5 in | Wt 169.0 lb

## 2017-04-23 DIAGNOSIS — J189 Pneumonia, unspecified organism: Secondary | ICD-10-CM

## 2017-04-23 NOTE — Patient Instructions (Addendum)
  COme back in three weeks. Pneumonia shot and flu shot at next visit.     IF you received an x-ray today, you will receive an invoice from [facility]. Please contact [facility] at [phone] with questions or concerns regarding your invoice.   IF you received labwork today, you will receive an invoice from [facility]. Please contact [facility] at [phone] with questions or concerns regarding your invoice.   Our billing staff will not be able to assist you with questions regarding bills from these companies.  You will be contacted with the lab results as soon as they are available. The fastest way to get your results is to activate your My Chart account. Instructions are located on the last page of this paperwork. If you have not heard from us regarding the results in 2 weeks, please contact this office.

## 2017-04-23 NOTE — Progress Notes (Signed)
    04/24/2017 4:42 PM   DOB: June 26, 1955 / MRN: [id number]  SUBJECTIVE:  Jason Schaefer is a 62 y.o. male presenting for pneumoina.  He is 60% improved. Conitues to have some pleuritic pain.  Was prescribed Levaquin and is continued on this. He denies chest pain, SOB, leg swelling.  Has been taking meds for five days now.   There is no immunization history on file for this patient.   He is allergic to peanut-containing drug products.   He  has a past medical history of Hepatitis B.    He  reports that  has never smoked. he has never used smokeless tobacco. He reports that he drinks alcohol. He reports that he does not use drugs. He  has no sexual activity history on file. The patient  has a past surgical history that includes Abdominal surgery and Hernia repair.  His family history is not on file.  ROS  Positive for productive cough negative for hemoptysis, shortness of breath wheezing no chest pain leg swelling.  Negative for abdominal pain negative for fever.  The problem list and medications were reviewed and updated by myself where necessary and exist elsewhere in the encounter.   OBJECTIVE:  BP 98/66   Pulse 86   Temp 98.5 F (36.9 C) (Oral)   Resp 17   Ht 5' 6.5" (1.689 m)   Wt 169 lb (76.7 kg)   SpO2 98%   BMI 26.87 kg/m   Wt Readings from Last 3 Encounters:  04/23/17 169 lb (76.7 kg)  04/18/17 167 lb 9.6 oz (76 kg)  04/02/17 172 lb 12.8 oz (78.4 kg)   Temp Readings from Last 3 Encounters:  04/23/17 98.5 F (36.9 C) (Oral)  04/18/17 99.1 F (37.3 C) (Oral)  04/02/17 98.5 F (36.9 C) (Oral)   BP Readings from Last 3 Encounters:  04/23/17 98/66  04/18/17 122/70  04/02/17 (!) 148/81   Pulse Readings from Last 3 Encounters:  04/23/17 86  04/18/17 83  04/02/17 92     Physical Exam  Constitutional: He is active and cooperative.  Cardiovascular: Normal rate and regular rhythm.  Pulmonary/Chest: Effort normal. No tachypnea.  Is negative for respiratory  distress.  Mild crackles about the bilateral bases.  Neurological: He is alert.  Skin: Skin is warm.  Negative for cyanosis  Vitals reviewed.   No results found for this or any previous visit (from the past 72 hour(s)).  No results found.  ASSESSMENT AND PLAN:  Jason Schaefer was seen today for follow-up.  Diagnoses and all orders for this visit:  Community acquired pneumonia, unspecified laterality: Patient here today for follow-up of community-acquired pneumonia.  Prescribed Levaquin 500 mg for 7 days.  He is medication compliant and is improving clinically.  He will come back in about 3 weeks for repeat imaging with myself or [facility].    The patient is advised to call or return to clinic if he does not see an improvement in symptoms, or to seek the care of the closest emergency department if he worsens with the above plan.   Deliah BostonMichael Hollyanne Schloesser, MHS, PA-C [facility] 04/24/2017 4:42 PM

## 2017-09-22 ENCOUNTER — Other Ambulatory Visit: Payer: Self-pay | Admitting: Gastroenterology

## 2017-09-22 DIAGNOSIS — B181 Chronic viral hepatitis B without delta-agent: Secondary | ICD-10-CM

## 2017-09-24 ENCOUNTER — Other Ambulatory Visit: Payer: Self-pay | Admitting: Gastroenterology

## 2017-09-24 DIAGNOSIS — R634 Abnormal weight loss: Secondary | ICD-10-CM

## 2017-10-02 ENCOUNTER — Other Ambulatory Visit: Payer: Self-pay

## 2017-10-08 ENCOUNTER — Ambulatory Visit
Admission: RE | Admit: 2017-10-08 | Discharge: 2017-10-08 | Disposition: A | Discharge status: Home / Self Care | Payer: No Typology Code available for payment source | Source: Ambulatory Visit | Attending: Gastroenterology | Admitting: Gastroenterology

## 2017-10-08 DIAGNOSIS — R634 Abnormal weight loss: Secondary | ICD-10-CM

## 2017-10-08 MED ORDER — IOPAMIDOL (ISOVUE-300) INJECTION 61%
100.0000 mL | Freq: Once | INTRAVENOUS | Status: AC | PRN
  Administered 2017-10-08: 100 mL via INTRAVENOUS

## 2018-03-29 IMAGING — US US EXTREM LOW VENOUS*L*
1 series · 13 of 24 positions shown · non-contrast
Comparison: None.

CLINICAL DATA: Left lower extremity pain and edema around knee.



[Series 1: us extrem low venous*left* · 0.08mm/px · 13 of 27 slices shown]
[im 1/27]
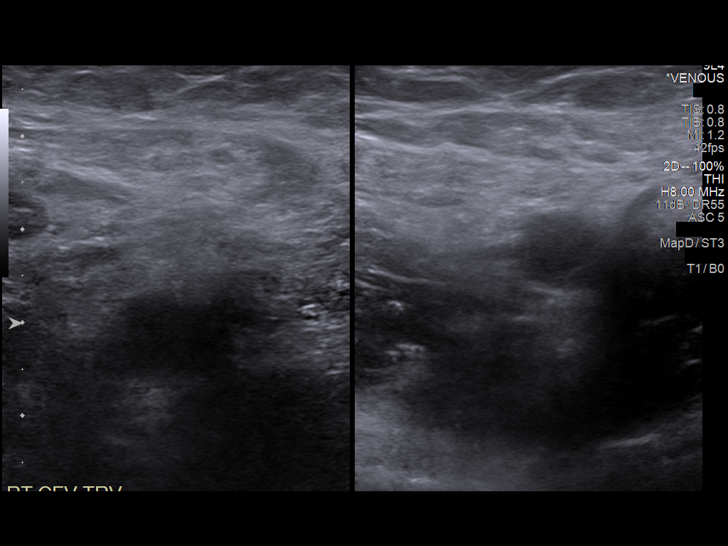
[im 3/27]
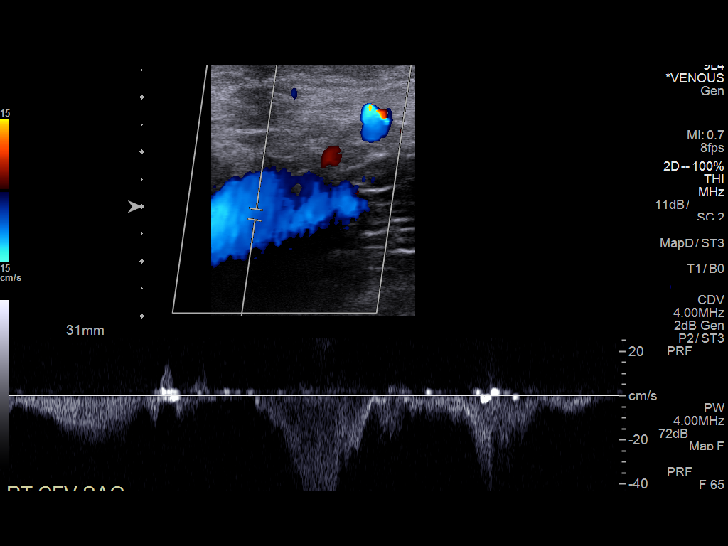
[im 5/27]
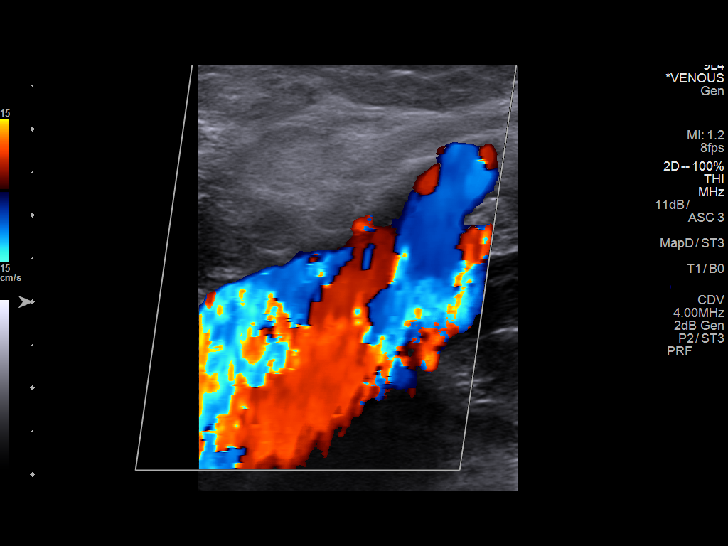
[im 7/27]
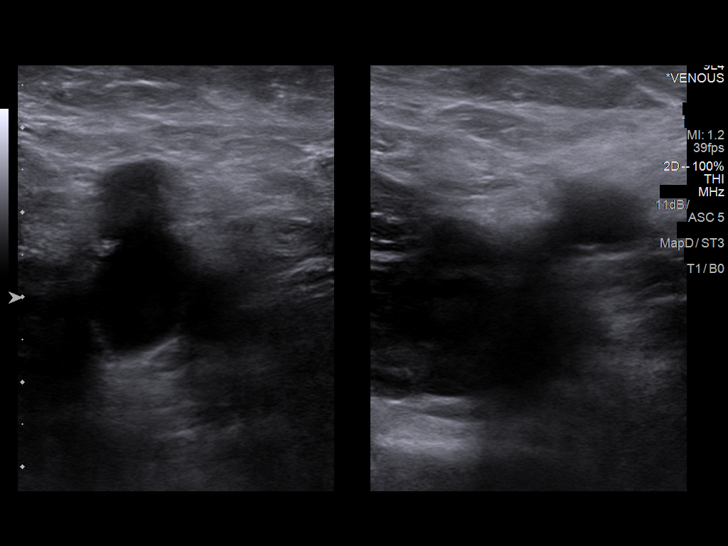
[im 10/27]
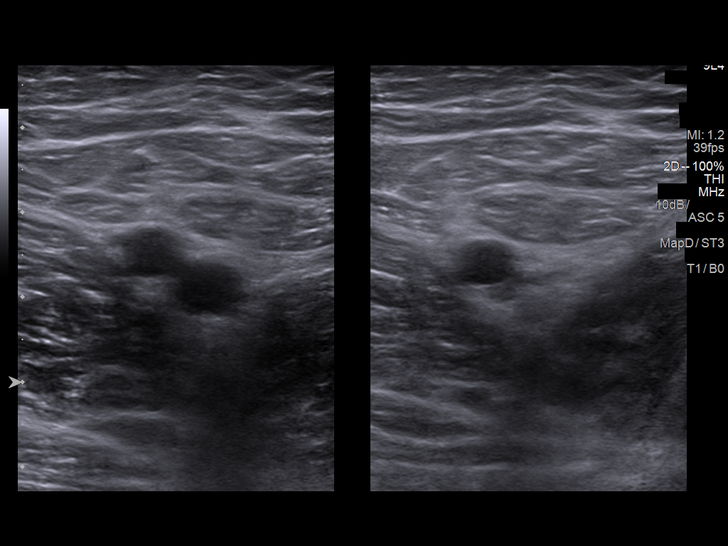
[im 12/27]
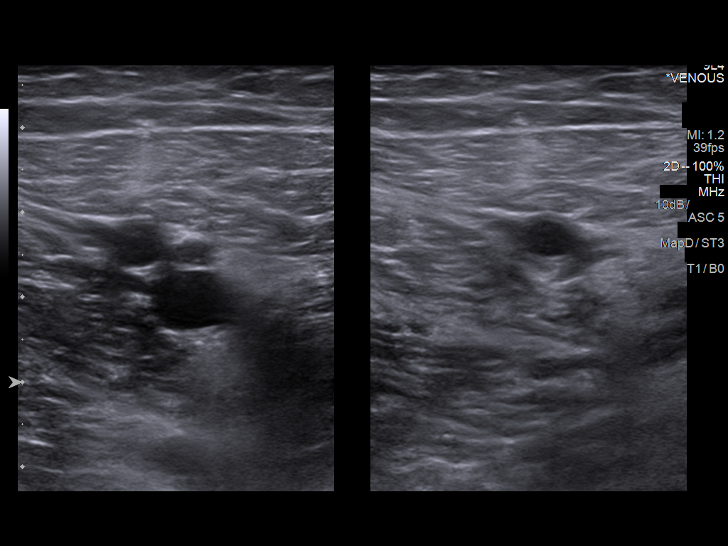
[im 14/27]
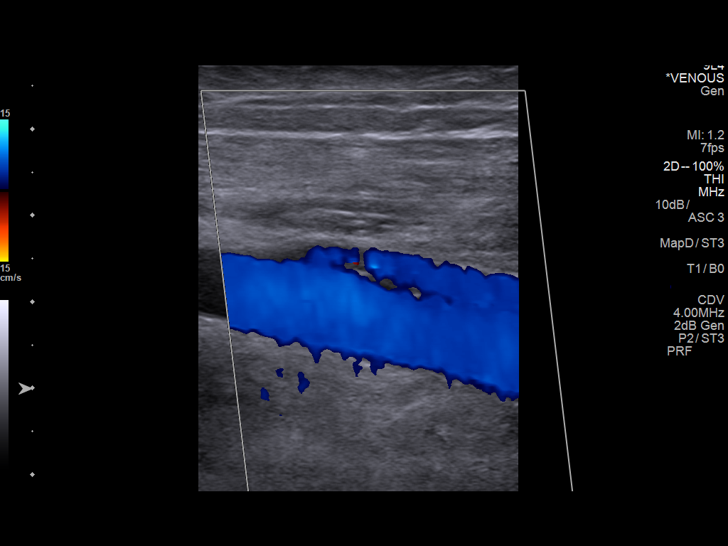
[im 15/27]
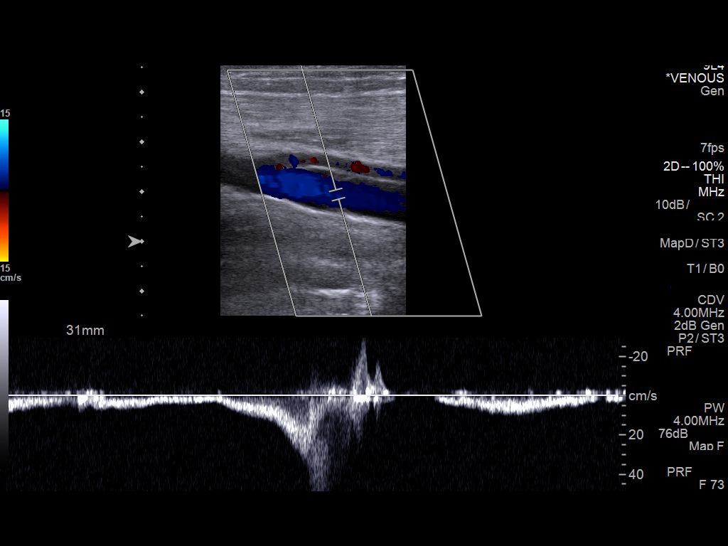
[im 17/27]
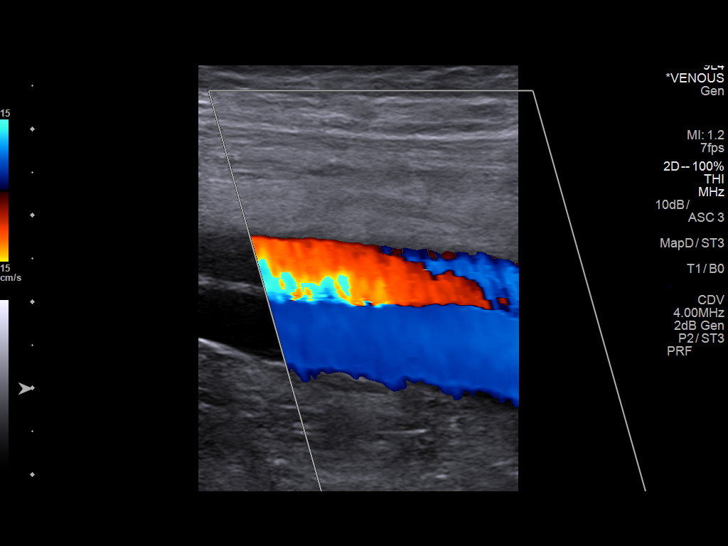
[im 20/27]
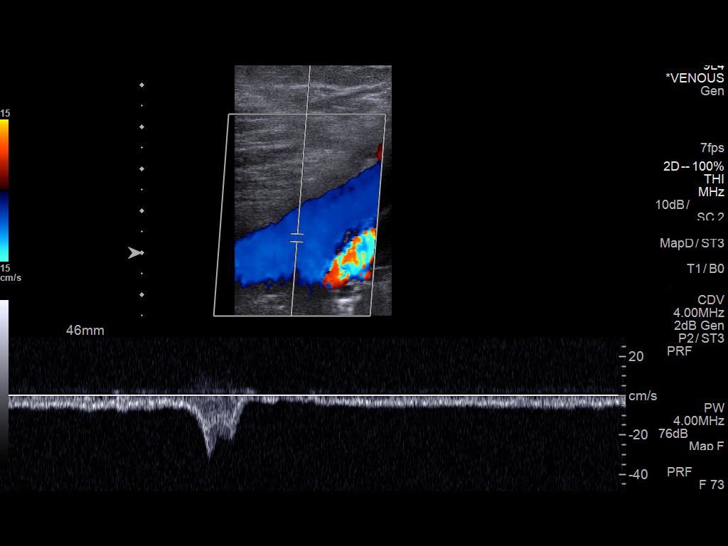
[im 22/27]
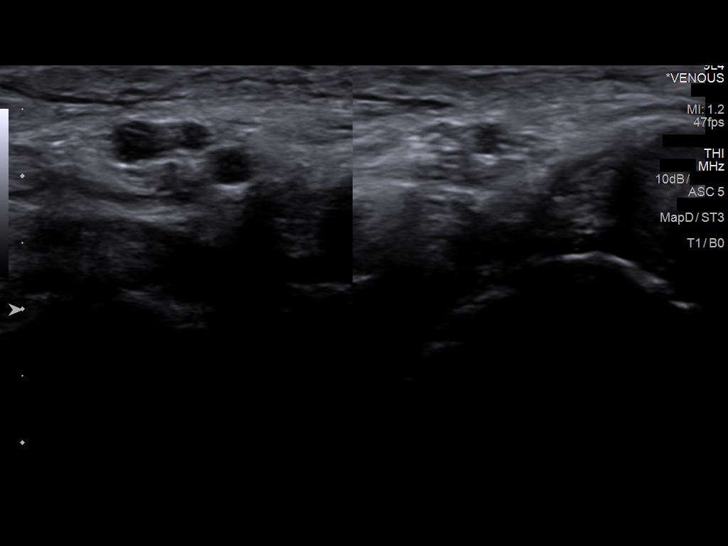
[im 24/27]
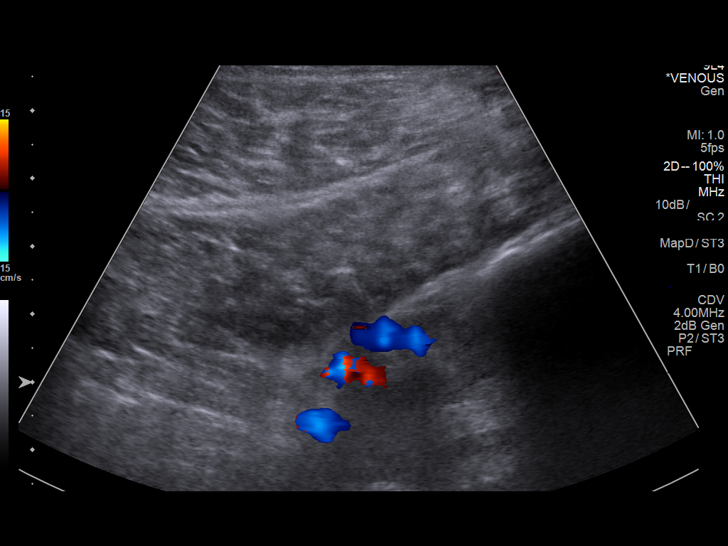
[im 27/27]
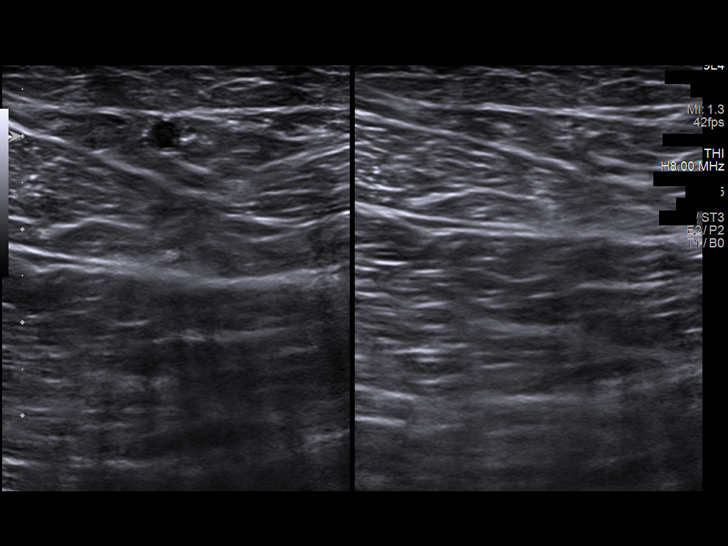

[13 of 24 positions shown; findings below may reference images not displayed]

FINDINGS: Contralateral Common Femoral Vein: Respiratory phasicity is normal
and symmetric with the symptomatic side. No evidence of thrombus.
Normal compressibility.

Common Femoral Vein: No evidence of thrombus. Normal
compressibility, respiratory phasicity and response to augmentation.

Saphenofemoral Junction: No evidence of thrombus. Normal
compressibility and flow on color Doppler imaging.

Profunda Femoral Vein: No evidence of thrombus. Normal
compressibility and flow on color Doppler imaging.

Femoral Vein: No evidence of thrombus. Normal compressibility,
respiratory phasicity and response to augmentation.

Popliteal Vein: No evidence of thrombus. Normal compressibility,
respiratory phasicity and response to augmentation.

Calf Veins: No evidence of thrombus. Normal compressibility and flow
on color Doppler imaging.

Superficial Great Saphenous Vein: No evidence of thrombus. Normal
compressibility and flow on color Doppler imaging.

Venous Reflux:  None.

Other Findings: No evidence of superficial thrombophlebitis or
abnormal fluid collection.
IMPRESSION: No evidence of left lower extremity deep venous thrombosis.

## 2018-05-23 ENCOUNTER — Encounter (HOSPITAL_BASED_OUTPATIENT_CLINIC_OR_DEPARTMENT_OTHER): Payer: Self-pay | Admitting: *Deleted

## 2018-05-23 ENCOUNTER — Emergency Department (HOSPITAL_BASED_OUTPATIENT_CLINIC_OR_DEPARTMENT_OTHER): Payer: No Typology Code available for payment source

## 2018-05-23 ENCOUNTER — Emergency Department (HOSPITAL_BASED_OUTPATIENT_CLINIC_OR_DEPARTMENT_OTHER)
Admission: EM | Admit: 2018-05-23 | Discharge: 2018-05-23 | Disposition: A | Discharge status: Home / Self Care | Payer: No Typology Code available for payment source | Attending: Emergency Medicine | Admitting: Emergency Medicine

## 2018-05-23 ENCOUNTER — Other Ambulatory Visit: Payer: Self-pay

## 2018-05-23 DIAGNOSIS — R51 Headache: Secondary | ICD-10-CM | POA: Diagnosis not present

## 2018-05-23 DIAGNOSIS — S199XXA Unspecified injury of neck, initial encounter: Secondary | ICD-10-CM | POA: Diagnosis present

## 2018-05-23 DIAGNOSIS — S161XXA Strain of muscle, fascia and tendon at neck level, initial encounter: Secondary | ICD-10-CM

## 2018-05-23 DIAGNOSIS — Y92410 Unspecified street and highway as the place of occurrence of the external cause: Secondary | ICD-10-CM | POA: Diagnosis not present

## 2018-05-23 DIAGNOSIS — Y998 Other external cause status: Secondary | ICD-10-CM | POA: Insufficient documentation

## 2018-05-23 DIAGNOSIS — Y9389 Activity, other specified: Secondary | ICD-10-CM | POA: Diagnosis not present

## 2018-05-23 MED ORDER — METHOCARBAMOL 500 MG PO TABS: 500.0000 mg | ORAL_TABLET | Freq: Two times a day (BID) | ORAL | 0 refills | Status: DC

## 2018-05-23 MED ORDER — ACETAMINOPHEN 500 MG PO TABS: 500.0000 mg | ORAL_TABLET | Freq: Four times a day (QID) | ORAL | 0 refills | Status: AC | PRN
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
## 2018-05-23 NOTE — ED Notes (Signed)
Signature pad not working in room.  Pt verbalizes understanding.
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
## 2018-08-11 ENCOUNTER — Other Ambulatory Visit: Payer: Self-pay

## 2018-08-11 ENCOUNTER — Ambulatory Visit (INDEPENDENT_AMBULATORY_CARE_PROVIDER_SITE_OTHER): Payer: Self-pay | Admitting: Family Medicine

## 2018-08-11 ENCOUNTER — Encounter: Payer: Self-pay | Admitting: Family Medicine

## 2018-08-11 VITALS — BP 166/80 | HR 72 | Temp 98.0°F | Resp 16 | Wt 176.0 lb

## 2018-08-11 DIAGNOSIS — B3789 Other sites of candidiasis: Secondary | ICD-10-CM

## 2018-08-11 DIAGNOSIS — L309 Dermatitis, unspecified: Secondary | ICD-10-CM

## 2018-08-11 DIAGNOSIS — R030 Elevated blood-pressure reading, without diagnosis of hypertension: Secondary | ICD-10-CM

## 2018-08-11 MED ORDER — CLOBETASOL PROPIONATE 0.05 % EX CREA: TOPICAL_CREAM | CUTANEOUS | 0 refills | Status: AC

## 2018-08-11 MED ORDER — CLOTRIMAZOLE 1 % EX CREA: TOPICAL_CREAM | CUTANEOUS | 0 refills | Status: AC

## 2018-08-11 NOTE — Patient Instructions (Addendum)
   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    Please make an appointment to recheck your blood pressure and have fasting labwork completed. Your last labwork was 1/19.  You may need medication to treat your blood pressure elevation.      If you have lab work done today you will be contacted with your lab results within the next 2 weeks.  If you have not heard from [place] then please contact us. The fastest way to get your results is to register for My Chart.   IF you received an x-ray today, you will receive an invoice from [facility]. Please contact [facility] at [phone] with questions or concerns regarding your invoice.   IF you received labwork today, you will receive an invoice from [facility]. Please contact [facility] at [phone] with questions or concerns regarding your invoice.   Our billing staff will not be able to assist you with questions regarding bills from these companies.  You will be contacted with the lab results as soon as they are available. The fastest way to get your results is to activate your My Chart account. Instructions are located on the last page of this paperwork. If you have not heard from [place] regarding the results in 2 weeks, please contact this office.

## 2018-08-11 NOTE — Progress Notes (Signed)
Acute Office Visit  Subjective:    Patient ID: Jason Schaefer, male    DOB: February 16, 1956, 63 y.o.   MRN: [id number]   HPI Patient is in today for skin dryness and pain on the hands bilat especially fingers, left ear. Pt with concern for groin irritation on the penis and legs. 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 pt states he has washed hands to prevent spread of COVID. Pt states he has applied antibiotic ointment to his hands-neosporin. Pt with no penile discharge. No pain with urination   Past Medical History:  Diagnosis Date  . Hepatitis B     Past Surgical History:  Procedure Laterality Date  . ABDOMINAL SURGERY    . HERNIA REPAIR      No family history on file.  Social History   Socioeconomic History  . Marital status: Single    Spouse name: Not on file  . Number of children: Not on file  . Years of education: Not on file  . Highest education level: Not on file  Occupational History  . Not on file  Social Needs  . Financial resource strain: Not on file  . Food insecurity:    Worry: Not on file    Inability: Not on file  . Transportation needs:    Medical: Not on file    Non-medical: Not on file  Tobacco Use  . Smoking status: Never Smoker  . Smokeless tobacco: Never Used  Substance and Sexual Activity  . Alcohol use: Yes    Comment: weekly  . Drug use: No  . Sexual activity: Not on file  Lifestyle  . Physical activity:    Days per week: Not on file    Minutes per session: Not on file  . Stress: Not on file  Relationships  . Social connections:    Talks on phone: Not on file    Gets together: Not on file    Attends religious service: Not on file    Active member of club or organization: Not on file    Attends meetings of clubs or organizations: Not on file    Relationship status: Not on file  . Intimate partner violence:    Fear of current or ex partner: Not on file    Emotionally abused: Not on file     Physically abused: Not on file    Forced sexual activity: Not on file  Other Topics Concern  . Not on file  Social History Narrative  . Not on file    Outpatient Medications Prior to Visit  Medication Sig Dispense Refill  . acetaminophen (TYLENOL) 500 MG tablet Take 1 tablet (500 mg total) by mouth every 6 (six) hours as needed. 30 tablet 0  . albuterol (PROVENTIL HFA;VENTOLIN HFA) 108 (90 Base) MCG/ACT inhaler Inhale 2 puffs into the lungs every 6 (six) hours as needed. 1 Inhaler 1  . HYDROcodone-homatropine (HYCODAN) 5-1.5 MG/5ML syrup Take 5 mLs by mouth at bedtime as needed. 100 mL 0  . levofloxacin (LEVAQUIN) 500 MG tablet Take 1 tablet (500 mg total) by mouth daily. 7 tablet 0  . methocarbamol (ROBAXIN) 500 MG tablet Take 1 tablet (500 mg total) by mouth 2 (two) times daily. 20 tablet 0   No facility-administered medications prior to visit.     No Active Allergies  ROS CV-no CP PUL-no SOB, cough INTEG: persistent callous on hands bilat, left ear-now cracks that are painful with hand washing Groin/penis-rough  skin, itch, no drainage, no sores     Today's Vitals   08/11/18 1635  BP: (!) 166/80  Pulse: 72  Resp: 16  Temp: 98 F (36.7 C)  TempSrc: Oral  SpO2: 95%  Weight: 176 lb (79.8 kg)   Body mass index is 28.41 kg/m.  EXAM:  Constitutional:  HEENT: normocephalic, atraumatic, left ear-hyperpigmented macule External ear bilat  with no lesions or masses Lungs: clear breath sounds to auscultation Heart:  normal S1/2 with no murmur on auscultation Skin: callous on hands, finger tips, distal second and third finger-cracks-no drainage noted, no edema, no eryth Penis-thicken skin with no lesions or callous  Health Maintenance Due  Topic Date Due  . Hepatitis C Screening  25-Dec-1955  . HIV Screening  11/04/1970  . TETANUS/TDAP  11/04/1974  . COLONOSCOPY  11/03/2005    No results found for: TSH Lab Results  Component Value Date   WBC 7.0 04/18/2017   HGB  13.7 (A) 04/18/2017   HCT 42.0 (A) 04/18/2017   MCV 79.5 (A) 04/18/2017   PLT 274 03/25/2017   Lab Results  Component Value Date   NA 144 03/25/2017   K 4.6 03/25/2017   CO2 26 03/25/2017   GLUCOSE 85 03/25/2017   BUN 14 03/25/2017   CREATININE 0.74 (L) 03/25/2017   BILITOT 0.4 03/25/2017   ALKPHOS 67 03/25/2017   AST 347 (H) 03/25/2017   ALT 272 (H) 03/25/2017   PROT 6.2 03/25/2017   ALBUMIN 3.1 (L) 03/25/2017   CALCIUM 8.6 03/25/2017    Lab Results  Component Value Date   HGBA1C 5.9 (H) 03/25/2017       Assessment & Plan:   Problem List Items Addressed This Visit    None     ASSESSMENT/PLAN:  1. Eczema of tip of finger-clobetasol-rx, cotton gloves for night with medication, driving gloves and rubber gloves for cleaning  2. Eczema of both hands-clobetasol-rx max 2 weeks  3. Candida rash of groin-likely fungal with underlying eczema-wash with dove to avoid loss of moisture in skin. Avoid topical steroids to groin area  Other orders - clobetasol cream (TEMOVATE) 0.05 %; Apply to hands and ear twice a day for 2 weeks - clotrimazole (LOTRIMIN) 1 % cream; Apply to genitals twice a day  Elevated blood pressure Pt to return to clinic to discuss medications for blood pressure-pt currently on no medication for blood pressure control No recent blood work-h/o chronic hepatitis B. Pt with elevated blood pressure in the past. No CP/no SOB. Needs fasting labwork.     Salim Forero Mat CarneLEIGH Oaklee Esther, MD

## 2018-08-12 DIAGNOSIS — R030 Elevated blood-pressure reading, without diagnosis of hypertension: Secondary | ICD-10-CM | POA: Insufficient documentation
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
## 2022-02-08 ENCOUNTER — Other Ambulatory Visit: Payer: Self-pay

## 2022-02-08 ENCOUNTER — Emergency Department (HOSPITAL_BASED_OUTPATIENT_CLINIC_OR_DEPARTMENT_OTHER)
Admission: EM | Admit: 2022-02-08 | Discharge: 2022-02-08 | Disposition: A | Discharge status: Home / Self Care | Payer: Self-pay | Attending: Emergency Medicine | Admitting: Emergency Medicine

## 2022-02-08 ENCOUNTER — Emergency Department (HOSPITAL_BASED_OUTPATIENT_CLINIC_OR_DEPARTMENT_OTHER): Payer: Self-pay

## 2022-02-08 ENCOUNTER — Encounter (HOSPITAL_BASED_OUTPATIENT_CLINIC_OR_DEPARTMENT_OTHER): Payer: Self-pay | Admitting: Emergency Medicine

## 2022-02-08 DIAGNOSIS — M109 Gout, unspecified: Secondary | ICD-10-CM | POA: Insufficient documentation

## 2022-02-08 DIAGNOSIS — I10 Essential (primary) hypertension: Secondary | ICD-10-CM | POA: Insufficient documentation

## 2022-02-08 HISTORY — DX: Gout, unspecified: M10.9

## 2022-02-08 MED ORDER — KETOROLAC TROMETHAMINE 15 MG/ML IJ SOLN
15.0000 mg | Freq: Once | INTRAMUSCULAR | Status: AC
  Administered 2022-02-08: 15 mg via INTRAMUSCULAR
  Filled 2022-02-08: qty 1

## 2022-02-08 MED ORDER — INDOMETHACIN 50 MG PO CAPS: 50.0000 mg | ORAL_CAPSULE | Freq: Three times a day (TID) | ORAL | 0 refills | Status: DC

## 2022-02-08 MED ORDER — PREDNISONE 10 MG PO TABS: 60.0000 mg | ORAL_TABLET | Freq: Every day | ORAL | 0 refills | Status: DC

## 2022-02-08 MED ORDER — PREDNISONE 50 MG PO TABS
60.0000 mg | ORAL_TABLET | Freq: Once | ORAL | Status: AC
  Administered 2022-02-08: 60 mg via ORAL
  Filled 2022-02-08: qty 1

## 2022-02-08 NOTE — ED Triage Notes (Addendum)
Right knee pain and swelling x 2 days. Denies injury, color change. 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 Pt ambulatory with limp.

## 2022-02-08 NOTE — ED Provider Notes (Signed)
[facility] EMERGENCY DEPARTMENT Provider Note   CSN: [id number] Arrival date & time: 02/08/22  1436     History  Chief Complaint  Patient presents with   Knee Pain    Jason Schaefer is a 66 y.o. male.   Knee Pain   66 year old male presents emergency department complaints of right knee.  Patient reports history of gout affecting both toes and his knee in the past.  He reports symptom onset approximately 2 to 3 days ago insidiously.  Denies any known traumatic mechanism, feelings of instability, weakness, sensory deficits in affected leg, fever, history of IV drug use.  Patient states it feels "the exact same as prior flareup."  Reports taking Aleve at home with minimal to no relief of symptoms.  He states the prednisone as well as an Methasone has helped his flareups in the past.  States he has been eating ambulation at home with crutches.  Pain is worsened with weightbearing as well as with certain movements.  Patient states that over the past week to 2 weeks, he has been eating more sausage biscuits as well as drinking more beer at home.  Past medical history significant for gout, hepatitis B  Home Medications Prior to Admission medications   Medication Sig Start Date End Date Taking? Authorizing Provider  indomethacin (INDOCIN) 50 MG capsule Take 1 capsule (50 mg total) by mouth 3 (three) times daily with meals. 02/08/22  Yes Sherian Maroon A, PA  predniSONE (DELTASONE) 10 MG tablet Take 6 tablets (60 mg total) by mouth daily. 02/08/22  Yes Sherian Maroon A, PA  acetaminophen (TYLENOL) 500 MG tablet Take 1 tablet (500 mg total) by mouth every 6 (six) hours as needed. 05/23/18   Law, Waylan Boga, PA-C  albuterol (PROVENTIL HFA;VENTOLIN HFA) 108 (90 Base) MCG/ACT inhaler Inhale 2 puffs into the lungs every 6 (six) hours as needed. 04/02/17   Wallis Bamberg, PA-C  clobetasol cream (TEMOVATE) 0.05 % Apply to hands and ear twice a day for 2 weeks 08/11/18   Wandra Feinstein, MD   clotrimazole (LOTRIMIN) 1 % cream Apply to genitals twice a day 08/11/18   Wandra Feinstein, MD      Allergies    Patient has no known allergies.    Review of Systems   Review of Systems  All other systems reviewed and are negative.   Physical Exam Updated Vital Signs BP (!) 150/94   Pulse 82   Temp 98.8 F (37.1 C) (Oral)   Resp 17   Ht 5\' 6"  (1.676 m)   Wt 79.4 kg   SpO2 98%   BMI 28.25 kg/m  Physical Exam Vitals and nursing note reviewed.  Constitutional:      General: He is not in acute distress.    Appearance: He is well-developed.  HENT:     Head: Normocephalic and atraumatic.  Eyes:     Conjunctiva/sclera: Conjunctivae normal.  Cardiovascular:     Rate and Rhythm: Normal rate and regular rhythm.     Heart sounds: No murmur heard. Pulmonary:     Effort: Pulmonary effort is normal. No respiratory distress.     Breath sounds: Normal breath sounds.  Abdominal:     Palpations: Abdomen is soft.     Tenderness: There is no abdominal tenderness.  Musculoskeletal:        General: No swelling.     Cervical back: Neck supple.     Comments: Patient has full range of motion of bilateral knee  No strength out of 5.  Pedal pulses full and intact bilaterally.  Patient has tenderness to palpation of right lateral aspect of knee.  No patellar tenderness.  No joint instability appreciated.  Pain is elicited with direct palpation as well as when weightbearing.  Skin:    General: Skin is warm and dry.     Capillary Refill: Capillary refill takes less than 2 seconds.     Comments: Mildly swollen right lateral knee with no obvious erythema but difficult to assess due to patient's skin tone.  Area warm to the touch.    Neurological:     Mental Status: He is alert.  Psychiatric:        Mood and Affect: Mood normal.     ED Results / Procedures / Treatments   Labs (all labs ordered are listed, but only abnormal results are displayed) Labs Reviewed - No data to  display  EKG None  Radiology DG Knee Complete 4 Views Right  Result Date: 02/08/2022 CLINICAL DATA:  Right knee pain for 2 days EXAM: RIGHT KNEE - COMPLETE 4+ VIEW COMPARISON:  None Available. FINDINGS: No evidence of fracture, dislocation, or large joint effusion. Joint spaces are relatively well preserved. No evidence of arthropathy or other focal bone abnormality. Soft tissues are unremarkable. IMPRESSION: Negative. Electronically Signed   By: Duanne Guess D.O.   On: 02/08/2022 15:13    Procedures Procedures    Medications Ordered in ED Medications  predniSONE (DELTASONE) tablet 60 mg (60 mg Oral Given 02/08/22 1723)  ketorolac (TORADOL) 15 MG/ML injection 15 mg (15 mg Intramuscular Given 02/08/22 1720)    ED Course/ Medical Decision Making/ A&P                           Medical Decision Making Amount and/or Complexity of Data Reviewed Radiology: ordered.  Risk Prescription drug management.   This patient presents to the ED for concern of right knee pain, this involves an extensive number of treatment options, and is a complaint that carries with it a high risk of complications and morbidity.  The differential diagnosis includes fracture, strain/sprain, dislocation, osteoarthritis, rheumatoid arthritis, Reiter's syndrome, gout, osteomyelitis, malignancy, pseudogout   Co morbidities that complicate the patient evaluation  See HPI   Additional history obtained:  Additional history obtained from EMR External records from outside source obtained and reviewed including prior   Lab Tests:  N/a   Imaging Studies ordered:  I ordered imaging studies including right knee x-ray I independently visualized and interpreted imaging which showed no acute abnormalities I agree with the radiologist interpretation   Cardiac Monitoring: / EKG:  The patient was maintained on a cardiac monitor.  I personally viewed and interpreted the cardiac monitored which showed an  underlying rhythm of: This rhythm   Consultations Obtained:  N/a   Problem List / ED Course / Critical interventions / Medication management  Gout I ordered medication including prednisone and Toradol   Reevaluation of the patient after these medicines showed that the patient improved I have reviewed the patients home medicines and have made adjustments as needed   Social Determinants of Health:  Denies tobacco, illicit drug use   Test / Admission - Considered:  Gout Vitals signs significant for mild hypertension with a blood pressure 150/94.  Recommend close follow-up with PCP regarding elevation of blood pressure.. Otherwise within normal range and stable throughout visit. Laboratory/imaging studies significant for: See above Patient symptoms likely secondary  to acute gout flare.  We will treat with prednisone as well as indomethacin outpatient.  Low suspicion for septic arthritis at this time.  Patient recommended follow-up with PCP for possible beginnings of indomethacin at baseline.  Patient given educated regarding low purine diet for decrease likelihood of acute gout flareups.  Treatment plan discussed with patient he acknowledged understand was agreeable to said plan. Worrisome signs and symptoms were discussed with the patient, and the patient acknowledged understanding to return to the ED if noticed. Patient was stable upon discharge.          Final Clinical Impression(s) / ED Diagnoses Final diagnoses:  Acute gout of right knee, unspecified cause    Rx / DC Orders ED Discharge Orders          Ordered    indomethacin (INDOCIN) 50 MG capsule  3 times daily with meals        02/08/22 1726    predniSONE (DELTASONE) 10 MG tablet  Daily        02/08/22 1726              Wilnette Kales, [place] 02/08/22 1731    Hayden Rasmussen, MD 02/09/22 [phone]

## 2022-02-08 NOTE — ED Notes (Signed)
ED Provider at bedside. 

## 2022-02-08 NOTE — Discharge Instructions (Addendum)
Note the visit emergency department today was overall reassuring.  As discussed, follow low purine diet as this will decrease your likelihood of having gout flareups in the future.  Follow-up recommended with PCP for reevaluation of your symptoms as well as possible beginning of allopurinol to decrease likelihood of repeat flareups.  We will treat your current flareup with prednisone as well as indomethacin.  He got your first dose of prednisone today while emergency department so please continue that tomorrow.  You can begin in a medicine as soon as you pick it up from the pharmacy.  Please do not hesitate to return to emergency department for worrisome signs and symptoms we discussed become apparent.

## 2024-02-06 ENCOUNTER — Other Ambulatory Visit: Payer: Self-pay

## 2024-02-06 ENCOUNTER — Emergency Department (HOSPITAL_BASED_OUTPATIENT_CLINIC_OR_DEPARTMENT_OTHER)
Admission: EM | Admit: 2024-02-06 | Discharge: 2024-02-06 | Disposition: A | Discharge status: Home / Self Care | Payer: Self-pay | Attending: Emergency Medicine | Admitting: Emergency Medicine

## 2024-02-06 ENCOUNTER — Emergency Department (HOSPITAL_BASED_OUTPATIENT_CLINIC_OR_DEPARTMENT_OTHER): Payer: Self-pay

## 2024-02-06 DIAGNOSIS — J069 Acute upper respiratory infection, unspecified: Secondary | ICD-10-CM | POA: Insufficient documentation

## 2024-02-06 DIAGNOSIS — M109 Gout, unspecified: Secondary | ICD-10-CM | POA: Insufficient documentation

## 2024-02-06 LAB — CBC WITH DIFFERENTIAL/PLATELET
Abs Immature Granulocytes: 0.03 K/uL (ref 0.00–0.07)
Basophils Absolute: 0 K/uL (ref 0.0–0.1)
Basophils Relative: 1 %
Eosinophils Absolute: 0.3 K/uL (ref 0.0–0.5)
Eosinophils Relative: 5 %
HCT: 36.7 % — ABNORMAL LOW (ref 39.0–52.0)
Hemoglobin: 12 g/dL — ABNORMAL LOW (ref 13.0–17.0)
Immature Granulocytes: 1 %
Lymphocytes Relative: 26 %
Lymphs Abs: 1.5 K/uL (ref 0.7–4.0)
MCH: 25.8 pg — ABNORMAL LOW (ref 26.0–34.0)
MCHC: 32.7 g/dL (ref 30.0–36.0)
MCV: 78.9 fL — ABNORMAL LOW (ref 80.0–100.0)
Monocytes Absolute: 0.7 K/uL (ref 0.1–1.0)
Monocytes Relative: 13 %
Neutro Abs: 3.2 K/uL (ref 1.7–7.7)
Neutrophils Relative %: 54 %
Platelets: 251 K/uL (ref 150–400)
RBC: 4.65 MIL/uL (ref 4.22–5.81)
RDW: 14.6 % (ref 11.5–15.5)
WBC: 5.8 K/uL (ref 4.0–10.5)
nRBC: 0 % (ref 0.0–0.2)

## 2024-02-06 LAB — RESP PANEL BY RT-PCR (RSV, FLU A&B, COVID)  RVPGX2
Influenza A by PCR: NEGATIVE
Influenza B by PCR: NEGATIVE
Resp Syncytial Virus by PCR: NEGATIVE
SARS Coronavirus 2 by RT PCR: NEGATIVE

## 2024-02-06 LAB — COMPREHENSIVE METABOLIC PANEL WITH GFR
ALT: 40 U/L (ref 0–44)
AST: 30 U/L (ref 15–41)
Albumin: 3.9 g/dL (ref 3.5–5.0)
Alkaline Phosphatase: 92 U/L (ref 38–126)
Anion gap: 13 (ref 5–15)
BUN: 13 mg/dL (ref 8–23)
CO2: 24 mmol/L (ref 22–32)
Calcium: 9.2 mg/dL (ref 8.9–10.3)
Chloride: 101 mmol/L (ref 98–111)
Creatinine, Ser: 0.82 mg/dL (ref 0.61–1.24)
GFR, Estimated: >60 mL/min (ref >60)
Glucose, Bld: 117 mg/dL — ABNORMAL HIGH (ref 70–99)
Potassium: 4 mmol/L (ref 3.5–5.1)
Sodium: 138 mmol/L (ref 135–145)
Total Bilirubin: 0.3 mg/dL (ref 0.0–1.2)
Total Protein: 7.5 g/dL (ref 6.5–8.1)

## 2024-02-06 MED ORDER — PREDNISONE 50 MG PO TABS: 50.0000 mg | ORAL_TABLET | Freq: Every day | ORAL | 0 refills | Status: AC

## 2024-02-06 MED ORDER — INDOMETHACIN 50 MG PO CAPS: 50.0000 mg | ORAL_CAPSULE | Freq: Three times a day (TID) | ORAL | 0 refills | Status: AC

## 2024-02-06 NOTE — ED Triage Notes (Signed)
 Right knee swelling x 3 days. No injury. Hx of gout Difficulty walking and sleeping at night. Clemens due to becoming off balance with pain Pt also complains of productive cough x 3 days Coughing up blood and mucous. Denies SOB

## 2024-02-06 NOTE — ED Notes (Signed)
 Pt has URI and viral symptoms as well as some joint swelling. Wants eval for both.

## 2024-02-06 NOTE — ED Provider Notes (Signed)
 [facility] Provider Note   CSN: [id number] Arrival date & time: 02/06/24  1514     Patient presents with: Joint Swelling and Cough   Jason Schaefer is a 68 y.o. male.   Patient to ED for evaluation of right knee pain and swelling. He reports history of gout that feels similar. No calf or thigh pain. He also reports feeling unwell starting 5 days ago describing generalized body aches without fever. Four days ago he started coughing but he body aches resolved. Three days ago the cough became productive of bloody mucus. He denies chest pain, SoB, nausea, vomiting.    Cough      Prior to Admission medications   Medication Sig Start Date End Date Taking? Authorizing Provider  acetaminophen  (TYLENOL ) 500 MG tablet Take 1 tablet (500 mg total) by mouth every 6 (six) hours as needed. 05/23/18   Law, Alexandra M, PA-C  albuterol  (PROVENTIL  HFA;VENTOLIN  HFA) 108 (90 Base) MCG/ACT inhaler Inhale 2 puffs into the lungs every 6 (six) hours as needed. 04/02/17   Christopher Savannah, PA-C  clobetasol  cream (TEMOVATE ) 0.05 % Apply to hands and ear twice a day for 2 weeks 08/11/18   Eldora Olam CROME, MD  clotrimazole  (LOTRIMIN ) 1 % cream Apply to genitals twice a day 08/11/18   Eldora Olam CROME, MD  indomethacin  (INDOCIN ) 50 MG capsule Take 1 capsule (50 mg total) by mouth 3 (three) times daily with meals. 02/06/24   Odell Balls, PA-C  predniSONE  (DELTASONE ) 50 MG tablet Take 1 tablet (50 mg total) by mouth daily. 02/06/24   Odell Balls, PA-C    Allergies: Patient has no known allergies.    Review of Systems  Respiratory:  Positive for cough.     Updated Vital Signs BP (!) 141/85 (BP Location: Right Arm)   Pulse 77   Temp 98 F (36.7 C) (Oral)   Resp 20   Wt 72.6 kg   SpO2 100%   BMI 25.82 kg/m   Physical Exam Constitutional:      Appearance: He is well-developed.  HENT:     Head: Normocephalic.  Cardiovascular:     Rate and Rhythm: Normal rate  and regular rhythm.     Heart sounds: No murmur heard. Pulmonary:     Effort: Pulmonary effort is normal.     Breath sounds: Normal breath sounds. No wheezing, rhonchi or rales.     Comments: No active coughing observed.  Abdominal:     General: Bowel sounds are normal.     Palpations: Abdomen is soft.     Tenderness: There is no abdominal tenderness. There is no guarding or rebound.  Musculoskeletal:     Cervical back: Normal range of motion and neck supple.     Comments: Right knee is moderately swollen. There is no redness or warmth. No tenderness above or below the joint. Distal pulses intact.   Skin:    General: Skin is warm and dry.  Neurological:     General: No focal deficit present.     Mental Status: He is alert and oriented to person, place, and time.     (all labs ordered are listed, but only abnormal results are displayed) Labs Reviewed  CBC WITH DIFFERENTIAL/PLATELET - Abnormal; Notable for the following components:      Result Value   Hemoglobin 12.0 (*)    HCT 36.7 (*)    MCV 78.9 (*)    MCH 25.8 (*)    All  other components within normal limits  COMPREHENSIVE METABOLIC PANEL WITH GFR - Abnormal; Notable for the following components:   Glucose, Bld 117 (*)    All other components within normal limits  RESP PANEL BY RT-PCR (RSV, FLU A&B, COVID)  [id number]    EKG: None  Radiology: DG Chest 2 View Result Date: 02/06/2024 CLINICAL DATA:  swelling EXAM: CHEST - 2 VIEW COMPARISON:  04/18/2017 FINDINGS: Lower lung volumes. Biapical pleural thickening. Streaky opacities in both lung bases, likely atelectasis. No pleural effusion or pneumothorax. The cardiac silhouette is at the upper limits of normal, likely accentuated by AP technique and low lung volumes. No acute fracture or destructive lesions. Multilevel thoracic osteophytosis. IMPRESSION: Low lung volumes with streaky bibasilar atelectasis. Electronically Signed   By: Rogelia Myers M.D.   On: 02/06/2024 16:53    DG Knee Complete 4 Views Right Result Date: 02/06/2024 CLINICAL DATA:  swelling EXAM: RIGHT KNEE - COMPLETE 4+ VIEW COMPARISON:  02/08/2022 FINDINGS: No acute fracture or dislocation. Small joint effusion. There is no evidence of arthropathy or other focal bone abnormality. Peripheral vascular atherosclerosis. IMPRESSION: Small joint effusion.  No acute fracture or dislocation. Electronically Signed   By: Rogelia Myers M.D.   On: 02/06/2024 16:43     Procedures   Medications Ordered in the ED - No data to display  Clinical Course as of 02/06/24 1820  Fri Feb 06, 2024  1649 Patient to ED with symptoms of recurrent gout in the right knee. No ss/sxs of septic joint.   Also reports cough productive of bloody mucus for the past 2-3 days. No fever. No risk factors or PE, no tachycardia, no hypoxia. No cough observed in the ED. CXR unremarkable without consolidation, edema, effusions. Labs unremarkable [SU]  1810 The patient is seen and evaluated by Dr. Yolande. Confirms URI symptoms of ST, congestion in addition to cough. No lower leg or foot edema. No recent travel or h/o PE.  Continues to be felt extremely low risk for PE.   Chart reviewed. Multiple ED visits for gout in knee, right or left. No ss/sxs c/w septic joint.   He is felt appropriate for discharge home. Will restart Indomethacin  and prednisone  for gouty arthritis. Recommend supportive care for viral URI [SU]    Clinical Course User Index [SU] Odell Balls, PA-C                                 Medical Decision Making Amount and/or Complexity of Data Reviewed Labs: ordered. Radiology: ordered.        Final diagnoses:  Viral URI with cough  Acute gout of right knee, unspecified cause    ED Discharge Orders          Ordered    indomethacin  (INDOCIN ) 50 MG capsule  3 times daily with meals        02/06/24 1817    predniSONE  (DELTASONE ) 50 MG tablet  Daily        02/06/24 1817               Odell Balls, PA-C 02/06/24 1820    Yolande Lamar BROCKS, MD 02/11/24 1304

## 2024-02-06 NOTE — Discharge Instructions (Signed)
 Take the medications provided for gout of the right knee. Cool compresses should help with swelling. Rest the knee as much as possible.   Recommend supportive care of the upper respiratory infection: Tylenol  for any aches or fever; over-the-counter cough preparations like Robitussin are usually effective. Drink lots of fluids.   Continue efforts to establish with a primary care provider. Return to the ED with any new or concerning symptoms.
